# Patient Record
Sex: Female | Born: 1985 | Race: White | Hispanic: No | Marital: Single | Smoking: Never smoker
Health system: Southern US, Community
[De-identification: ages and names within clinical notes are randomized; demographics above are authoritative.]

## PROBLEM LIST (undated history)

## (undated) HISTORY — PX: APPENDECTOMY: SHX54

## (undated) HISTORY — PX: MENISCUS REPAIR: SHX5179

---
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
## 2005-06-29 ENCOUNTER — Other Ambulatory Visit: Admission: RE | Admit: 2005-06-29 | Discharge: 2005-06-29 | Payer: Self-pay | Admitting: Obstetrics and Gynecology

## 2005-09-07 ENCOUNTER — Other Ambulatory Visit: Admission: RE | Admit: 2005-09-07 | Discharge: 2005-09-07 | Payer: Self-pay | Admitting: Obstetrics & Gynecology

## 2010-11-09 ENCOUNTER — Other Ambulatory Visit: Payer: Self-pay | Admitting: Internal Medicine

## 2010-11-09 DIAGNOSIS — R1011 Right upper quadrant pain: Secondary | ICD-10-CM

## 2010-11-09 DIAGNOSIS — R110 Nausea: Secondary | ICD-10-CM

## 2010-11-10 ENCOUNTER — Ambulatory Visit
Admission: RE | Admit: 2010-11-10 | Discharge: 2010-11-10 | Disposition: A | Discharge status: Home / Self Care | Payer: 59 | Source: Ambulatory Visit | Attending: Internal Medicine | Admitting: Internal Medicine

## 2010-11-10 DIAGNOSIS — R1011 Right upper quadrant pain: Secondary | ICD-10-CM

## 2010-11-10 DIAGNOSIS — R110 Nausea: Secondary | ICD-10-CM

## 2010-11-13 ENCOUNTER — Ambulatory Visit
Admission: RE | Admit: 2010-11-13 | Discharge: 2010-11-13 | Disposition: A | Discharge status: Home / Self Care | Payer: 59 | Source: Ambulatory Visit | Attending: Internal Medicine | Admitting: Internal Medicine

## 2010-11-13 ENCOUNTER — Other Ambulatory Visit: Payer: Self-pay | Admitting: Internal Medicine

## 2010-11-13 DIAGNOSIS — R634 Abnormal weight loss: Secondary | ICD-10-CM

## 2010-11-13 DIAGNOSIS — R110 Nausea: Secondary | ICD-10-CM

## 2010-11-13 MED ORDER — IOHEXOL 300 MG/ML  SOLN
100.0000 mL | Freq: Once | INTRAMUSCULAR | Status: AC | PRN
  Administered 2010-11-13: 100 mL via INTRAVENOUS

## 2011-07-15 ENCOUNTER — Ambulatory Visit (INDEPENDENT_AMBULATORY_CARE_PROVIDER_SITE_OTHER): Payer: 59 | Admitting: Family Medicine

## 2011-07-15 ENCOUNTER — Ambulatory Visit: Payer: 59

## 2011-07-15 VITALS — BP 126/81 | HR 65 | Temp 98.9°F | Resp 16 | Ht 65.0 in | Wt 153.0 lb

## 2011-07-15 DIAGNOSIS — K5900 Constipation, unspecified: Secondary | ICD-10-CM

## 2011-07-15 DIAGNOSIS — R1011 Right upper quadrant pain: Secondary | ICD-10-CM

## 2011-07-15 DIAGNOSIS — R209 Unspecified disturbances of skin sensation: Secondary | ICD-10-CM

## 2011-07-15 DIAGNOSIS — R200 Anesthesia of skin: Secondary | ICD-10-CM

## 2011-07-15 MED ORDER — HYOSCYAMINE SULFATE ER 0.375 MG PO TB12: 0.3750 mg | ORAL_TABLET | Freq: Two times a day (BID) | ORAL | Status: DC | PRN

## 2011-07-15 NOTE — Progress Notes (Signed)
  Subjective:    Patient ID: Lydia Jensen, female    DOB: 1986/05/27, 26 y.o.   MRN: [id number]  HPI 26 yo female with GI complaints and numbness.  1) numbness - occurred during yoga last night.  Left elbow down. Still tingling.  Not weak. No injury.  Not cold.  Can feel, just feels different.  2)    Abdominal pian - off and on since June.  CT scan then showed constipation.  Was rec'd to see GI but she never followed through. Was also advised to use miralax.  Does off and on.  Continues to struggle off and on with constipation and diarrhea.  Stomach hurts most of the time, especially with pressure like when laying on it in yoga or pressing on it.  Thinks about it a lot.  Patient very worried about it.  Initially when it started has lost 20-25# without trying.  Since then fluctuates and overall weight up a little.     Review of Systems Negative except as per HPI     Objective:   Physical Exam  Constitutional: Vital signs are normal. She appears well-developed and well-nourished. She is active.  Cardiovascular: Normal rate, regular rhythm, normal heart sounds and normal pulses.   Pulmonary/Chest: Effort normal and breath sounds normal.  Abdominal: Soft. Normal appearance and bowel sounds are normal. She exhibits no distension and no mass. There is no hepatosplenomegaly. There is tenderness. There is no rigidity, no rebound, no guarding, no CVA tenderness, no tenderness at McBurney's point and negative Murphy's sign. No hernia.  Neurological: She is alert.   Tenderness is mild and mostly concentrated in RUQ.  [facility] Primary radiology reading by Dr. Georgiana Shore: Large stool in right colon with large gas bubble at hepatic flexure. No air fluid levels.     Assessment & Plan:  Abdominal pian, constipation.  Persistent and worrisome to patient.  ?IBS.  Continue miralax.  Try hyoscyamine for cramping.  GI Referral.   Paraesthesias - monitor.  INB in 3-4 days, rtc.

## 2011-07-16 ENCOUNTER — Telehealth: Payer: Self-pay

## 2011-07-16 NOTE — Telephone Encounter (Signed)
PT WANTS DR DOOLITTLE TO REVIEW HER X-RAY AND CALL HER ON CELL PHONE

## 2011-07-18 NOTE — Telephone Encounter (Signed)
Dr Merla Riches pts cell phone number is [phone]

## 2011-07-18 NOTE — Telephone Encounter (Signed)
Need cell phone # to be part of this message and all messages about callbacks

## 2011-07-19 ENCOUNTER — Telehealth: Payer: Self-pay

## 2011-07-19 NOTE — Telephone Encounter (Signed)
Patient's mother calling for patient who had to work today. Needs all records, notes, x-rays pertaining to digestive issues faxed to specialist Dr. Sharrell Ku at [phone]. Says Dr. Merla Riches advised her to see specialist before, and she never went. Decided to go after having another episode last week and being seen by. Dr. Georgiana Shore at walk-in center.

## 2011-07-23 ENCOUNTER — Encounter: Payer: Self-pay | Admitting: Gastroenterology

## 2011-08-03 ENCOUNTER — Ambulatory Visit: Payer: 59 | Admitting: Gastroenterology

## 2011-08-18 ENCOUNTER — Telehealth: Payer: Self-pay

## 2011-08-18 NOTE — Telephone Encounter (Signed)
Dr. Merla Riches patient  Needs refill on adderall xr 30 mg   and Adderall plain 15 mg    ALSO, needs copy of x-ray to take to MD appt next week.    Please call [phone]

## 2011-08-19 NOTE — Telephone Encounter (Signed)
Please pull chart and route message to pa pool. Thanks

## 2011-08-19 NOTE — Telephone Encounter (Signed)
Sorry accidentally hit close encounter instead of route. Ok to creat addendum

## 2011-08-19 NOTE — Telephone Encounter (Signed)
Dr Esaw Grandchild, do you want to RF pt's adderall xr 30 and adderall 15 Rxs? You saw her last month, but not for this. It looks like pt is due for f/up for ADD with Dr Merla Riches, but he is not here this week. I'll put pt's chart in your box. Please advise and we'll let the pt know

## 2011-08-20 NOTE — Telephone Encounter (Signed)
Xray is already copied and in p/up drawer.

## 2011-08-21 NOTE — Telephone Encounter (Signed)
No to adderall refill.  Did not discuss.

## 2011-08-23 NOTE — Telephone Encounter (Signed)
Pt came into p/up her xray and spoke with me about whether Dr Merla Riches would RF her XR Adderall. She said she doesn't need a RF of her reg bc she doesn't use it as often. Pt really can't get in to see Dr Merla Riches this week, but will reschedule soon if needed. She stated she was just here, but Dr Merla Riches wasn't here so she had to see another provider and didn't think to discuss ADD with her bc she wasn't her reg MD. She really can't afford another co-pay right now and doesn't really want to come back in so soon right after she was here. She is having to f/up with the abd issues she was here for.

## 2011-08-23 NOTE — Telephone Encounter (Signed)
LMOM for pt with message from Dr Georgiana Shore and left Dr Merla Riches schedule this week on VM.

## 2011-08-25 ENCOUNTER — Telehealth: Payer: Self-pay

## 2011-08-25 MED ORDER — AMPHETAMINE-DEXTROAMPHET ER 30 MG PO CP24: 30.0000 mg | ORAL_CAPSULE | ORAL | Status: DC

## 2011-08-25 NOTE — Telephone Encounter (Signed)
Okay to call her to pick up Adderall refills which I have tried to print tonight and can sign tomorrow morning

## 2011-08-25 NOTE — Telephone Encounter (Signed)
.  umfc The patient called to state that she had called regarding a prescription over a week ago and she had not received a return call.  No other open/resolved calls noted.  Patient would like return call at [phone] and stated she is a patient of Dr. Merla Riches, not Dr. Georgiana Shore who she saw in February.  Please call patient regarding this matter.

## 2011-08-25 NOTE — Telephone Encounter (Signed)
Addended by: Tonye Pearson on: 08/25/2011 11:45 PM   Modules accepted: Orders

## 2011-08-26 NOTE — Telephone Encounter (Signed)
LMOM that rx is ready

## 2011-08-26 NOTE — Telephone Encounter (Signed)
rx's are up front for pt to pick up. i informed her of this.

## 2011-10-01 ENCOUNTER — Ambulatory Visit (INDEPENDENT_AMBULATORY_CARE_PROVIDER_SITE_OTHER): Payer: 59 | Admitting: Internal Medicine

## 2011-10-01 VITALS — BP 132/85 | HR 76 | Temp 99.0°F | Resp 16 | Ht 66.75 in | Wt 156.8 lb

## 2011-10-01 DIAGNOSIS — F988 Other specified behavioral and emotional disorders with onset usually occurring in childhood and adolescence: Secondary | ICD-10-CM

## 2011-10-01 MED ORDER — AMPHETAMINE-DEXTROAMPHETAMINE 15 MG PO TABS: 15.0000 mg | ORAL_TABLET | Freq: Every day | ORAL | Status: DC

## 2011-10-01 MED ORDER — AMPHETAMINE-DEXTROAMPHET ER 30 MG PO CP24: 30.0000 mg | ORAL_CAPSULE | ORAL | Status: DC

## 2011-10-01 NOTE — Progress Notes (Signed)
  Subjective:    Patient ID: Lydia Jensen, female    DOB: [birth year], 26 y.o.   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 Kinnie Scales chronic abdominal pain has discovered no etiology and she is in the third week of the gluten-free diet/she has some insomnia related to the stress of the decisions before her and some daytime anxiety as well/she has intermittent constipation     Objective:   Physical Exam Vital signs stable Neuro intact        Assessment & Plan:  Problem #1 ADD Meds ordered this encounter  Medications  . DISCONTD: amphetamine-dextroamphetamine (ADDERALL) 15 MG tablet    Sig: Take 15 mg by mouth daily.   Marland Kitchen amphetamine-dextroamphetamine (ADDERALL) 15 MG tablet    Sig: Take 1 tablet (15 mg total) by mouth daily.    Dispense:  30 tablet    Refill:  0  . amphetamine-dextroamphetamine (ADDERALL XR) 30 MG 24 hr capsule    Sig: Take 1 capsule (30 mg total) by mouth every morning.    Dispense:  30 capsule    Refill:  0  . amphetamine-dextroamphetamine (ADDERALL XR) 30 MG 24 hr capsule    Sig: Take 1 capsule (30 mg total) by mouth every morning. 11/01/11/or after    Dispense:  30 capsule    Refill:  0  . amphetamine-dextroamphetamine (ADDERALL XR) 30 MG 24 hr capsule    Sig: Take 1 capsule (30 mg total) by mouth every morning. For 12/01/11 or after    Dispense:  30 capsule    Refill:  0  . amphetamine-dextroamphetamine (ADDERALL, 15MG ,) 15 MG tablet    Sig: Take 1 tablet (15 mg total) by mouth daily. For 11/01/11    Dispense:  30 tablet    Refill:  0  . amphetamine-dextroamphetamine (ADDERALL, 15MG ,) 15 MG tablet    Sig: Take 1 tablet (15 mg total) by mouth daily. For 12/01/11 or after    Dispense:  30 tablet    Refill:  0   Call in 3 months for refills and followup in 6 months

## 2012-01-16 ENCOUNTER — Telehealth: Payer: Self-pay

## 2012-01-16 NOTE — Telephone Encounter (Signed)
Patient request refill on Adderall 30mg  ex, 15mg  regular x 3 months.

## 2012-01-17 MED ORDER — AMPHETAMINE-DEXTROAMPHETAMINE 15 MG PO TABS: 15.0000 mg | ORAL_TABLET | Freq: Every day | ORAL | Status: DC

## 2012-01-17 MED ORDER — AMPHETAMINE-DEXTROAMPHET ER 30 MG PO CP24: 30.0000 mg | ORAL_CAPSULE | ORAL | Status: DC

## 2012-01-17 NOTE — Telephone Encounter (Signed)
LMOM that Rxs are ready for p/up and that she needs OV for more.

## 2012-01-17 NOTE — Telephone Encounter (Signed)
Rx done x 3 months then needs office visit 

## 2012-03-31 ENCOUNTER — Telehealth: Payer: Self-pay

## 2012-03-31 NOTE — Telephone Encounter (Signed)
Patient due for follow up this month, states no insurance. Please advise on request for medication change, not due for this to be filled until middle of month.

## 2012-03-31 NOTE — Telephone Encounter (Signed)
Pt of Dr. Merla Riches would like for his nurse to call her back about possibly changing her rx from adderall to retilin due to the fact that she has no insurance. [phone]

## 2012-03-31 NOTE — Telephone Encounter (Signed)
Dr. Doolittle, please advise. 

## 2012-04-02 NOTE — Telephone Encounter (Signed)
No answer was called back Closest medication would be Ritalin 20 mg regular release to be taken 3 times a day Left message that we would try to get in touch again

## 2012-04-04 ENCOUNTER — Telehealth: Payer: Self-pay

## 2012-04-04 NOTE — Telephone Encounter (Signed)
Returning Amy's call.    [phone]

## 2012-04-04 NOTE — Telephone Encounter (Signed)
I tried calling her this weekend and missed her Her only option with Ritalin would be to try 20 mg 3 times a day every 5 hours

## 2012-04-04 NOTE — Telephone Encounter (Signed)
Pt is returning amy/clincal call regarding her medication

## 2012-04-04 NOTE — Telephone Encounter (Signed)
Left message for her to call me back. 

## 2012-04-05 NOTE — Telephone Encounter (Signed)
Left another message for her. She called back yesterday evening.

## 2012-04-09 NOTE — Telephone Encounter (Signed)
Sent unable to reach letter.

## 2012-07-28 ENCOUNTER — Other Ambulatory Visit: Payer: Self-pay | Admitting: Physician Assistant

## 2012-07-28 MED ORDER — AMPHETAMINE-DEXTROAMPHET ER 30 MG PO CP24: 30.0000 mg | ORAL_CAPSULE | ORAL | Status: DC

## 2012-07-28 MED ORDER — AMPHETAMINE-DEXTROAMPHETAMINE 15 MG PO TABS: 15.0000 mg | ORAL_TABLET | Freq: Every day | ORAL | Status: DC

## 2012-07-28 MED ORDER — ALPRAZOLAM 0.5 MG PO TABS: 0.2500 mg | ORAL_TABLET | Freq: Every evening | ORAL | Status: DC | PRN

## 2012-07-28 NOTE — Telephone Encounter (Signed)
Pt presents to be seen by Dr Merla Riches but he was no longer at the clinic today.  I offered to write her 1 month supply of her medications and then she has to [facility] for a visit with Dr Merla Riches.  She agrees and understands.  The Rx were given to her while I was there.

## 2012-10-02 ENCOUNTER — Telehealth: Payer: Self-pay

## 2012-10-02 DIAGNOSIS — F988 Other specified behavioral and emotional disorders with onset usually occurring in childhood and adolescence: Secondary | ICD-10-CM

## 2012-10-02 NOTE — Telephone Encounter (Signed)
PT LIVES OUT OF TOWN AND WILL NOT BE ABLE TO COME SEE DR Merla Riches UNTIL 10/20/12 PT NEEDS A REFILL ON ADDERALL REFILL

## 2012-10-04 MED ORDER — AMPHETAMINE-DEXTROAMPHETAMINE 15 MG PO TABS: 15.0000 mg | ORAL_TABLET | Freq: Every day | ORAL | Status: DC

## 2012-10-04 MED ORDER — AMPHETAMINE-DEXTROAMPHET ER 30 MG PO CP24: 30.0000 mg | ORAL_CAPSULE | ORAL | Status: DC

## 2012-10-04 NOTE — Telephone Encounter (Signed)
Ok Meds ordered this encounter  Medications  . amphetamine-dextroamphetamine (ADDERALL XR) 30 MG 24 hr capsule    Sig: Take 1 capsule (30 mg total) by mouth every morning.    Dispense:  30 capsule    Refill:  0  . amphetamine-dextroamphetamine (ADDERALL) 15 MG tablet    Sig: Take 1 tablet (15 mg total) by mouth daily.    Dispense:  30 tablet    Refill:  0

## 2012-10-05 NOTE — Telephone Encounter (Signed)
Patient advised this is at front desk for pick up .

## 2012-10-20 ENCOUNTER — Ambulatory Visit: Payer: BC Managed Care – PPO | Admitting: Internal Medicine

## 2012-10-20 VITALS — BP 120/80 | HR 71 | Temp 98.1°F | Resp 18 | Ht 66.75 in | Wt 155.0 lb

## 2012-10-20 DIAGNOSIS — F988 Other specified behavioral and emotional disorders with onset usually occurring in childhood and adolescence: Secondary | ICD-10-CM

## 2012-10-20 DIAGNOSIS — M4120 Other idiopathic scoliosis, site unspecified: Secondary | ICD-10-CM | POA: Insufficient documentation

## 2012-10-20 DIAGNOSIS — K589 Irritable bowel syndrome without diarrhea: Secondary | ICD-10-CM

## 2012-10-20 MED ORDER — AMPHETAMINE-DEXTROAMPHETAMINE 15 MG PO TABS: 15.0000 mg | ORAL_TABLET | Freq: Every day | ORAL | Status: DC

## 2012-10-20 MED ORDER — AMPHETAMINE-DEXTROAMPHET ER 30 MG PO CP24: 30.0000 mg | ORAL_CAPSULE | ORAL | Status: DC

## 2012-10-20 MED ORDER — ALPRAZOLAM 0.5 MG PO TABS: 0.2500 mg | ORAL_TABLET | Freq: Every evening | ORAL | Status: DC | PRN

## 2012-10-20 NOTE — Progress Notes (Signed)
  Subjective:    Patient ID: Lydia Jensen, female    DOB: 1986-05-09, 27 y.o.   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 Kinnie Scales that did not include colonoscopy Unfortunately she still has 8-10 stools a day of diarrhea and postprandial right up quadrant pain(ultrasound was normal)  Now working out more and losing weight/diet much improved Rarely needs Xanax for anxiety /greatly improved//   Review of Systems change in birth control pills made headaches disappear    Negative  Objective:   Physical Exam BP 120/80  Pulse 71  Temp(Src) 98.1 F (36.7 C) (Oral)  Resp 18  Ht 5' 6.75" (1.695 m)  Wt 155 lb (70.308 kg)  BMI 24.47 kg/m2  LMP 10/20/2012  HEENT clear Abdomen benign Extremities with full peripheral pulses Scoliosis stable Mood good     Assessment & Plan:  Add  Scoliosis- letter for elevated desk  Meds ordered this encounter  Medications  . amphetamine-dextroamphetamine (ADDERALL) 15 MG tablet    Sig: Take 1 tablet (15 mg total) by mouth daily.    Dispense:  30 tablet    Refill:  0  . amphetamine-dextroamphetamine (ADDERALL XR) 30 MG 24 hr capsule    Sig: Take 1 capsule (30 mg total) by mouth every morning.    Dispense:  30 capsule    Refill:  0  . amphetamine-dextroamphetamine (ADDERALL) 15 MG tablet    Sig: Take 1 tablet (15 mg total) by mouth daily.    Dispense:  30 tablet    Refill:  0  . amphetamine-dextroamphetamine (ADDERALL XR) 30 MG 24 hr capsule    Sig: Take 1 capsule (30 mg total) by mouth every morning.    Dispense:  30 capsule    Refill:  0  . amphetamine-dextroamphetamine (ADDERALL XR) 30 MG 24 hr capsule    Sig: Take 1 capsule (30 mg total) by mouth every morning.    Dispense:  30 capsule    Refill:  0    Order Specific  Question:  Supervising Provider    Answer:  Terin Dierolf P [[id number]]  . amphetamine-dextroamphetamine (ADDERALL) 15 MG tablet    Sig: Take 1 tablet (15 mg total) by mouth daily.    Dispense:  30 tablet    Refill:  0    Order Specific Question:  Supervising Provider    Answer:  Avis Tirone P [[id number]]  . ALPRAZolam (XANAX) 0.5 MG tablet    Sig: Take 0.5-1 tablets (0.25-0.5 mg total) by mouth at bedtime as needed for sleep.    Dispense:  20 tablet    Refill:  0    Order Specific Question:  Supervising Provider    Answer:  Tonye Pearson [[id number]]  May call for refills in 3 months if needed and followup in 6 months

## 2013-02-23 ENCOUNTER — Telehealth: Payer: Self-pay

## 2013-02-23 DIAGNOSIS — F988 Other specified behavioral and emotional disorders with onset usually occurring in childhood and adolescence: Secondary | ICD-10-CM

## 2013-02-23 NOTE — Telephone Encounter (Signed)
Pt would like a refill on Adderall 30 mg xr and Adderall 15 mg. Best# [phone] pts mom will be picking these up: Delma Freeze

## 2013-02-25 MED ORDER — AMPHETAMINE-DEXTROAMPHET ER 30 MG PO CP24: 30.0000 mg | ORAL_CAPSULE | ORAL | Status: DC

## 2013-02-25 MED ORDER — AMPHETAMINE-DEXTROAMPHETAMINE 15 MG PO TABS: 15.0000 mg | ORAL_TABLET | Freq: Every day | ORAL | Status: DC

## 2013-02-25 NOTE — Telephone Encounter (Signed)
Called pt, LMOM Rx's ready to pick up. 

## 2013-02-25 NOTE — Telephone Encounter (Signed)
Meds ordered this encounter  Medications  . amphetamine-dextroamphetamine (ADDERALL) 15 MG tablet    Sig: Take 1 tablet (15 mg total) by mouth daily.    Dispense:  30 tablet    Refill:  0    Order Specific Question:  Supervising Provider    Answer:  Perrion Diesel P [[id number]]  . amphetamine-dextroamphetamine (ADDERALL XR) 30 MG 24 hr capsule    Sig: Take 1 capsule (30 mg total) by mouth every morning.    Dispense:  30 capsule    Refill:  0  . amphetamine-dextroamphetamine (ADDERALL XR) 30 MG 24 hr capsule    Sig: Take 1 capsule (30 mg total) by mouth every morning.    Dispense:  30 capsule    Refill:  0  . amphetamine-dextroamphetamine (ADDERALL XR) 30 MG 24 hr capsule    Sig: Take 1 capsule (30 mg total) by mouth every morning.    Dispense:  30 capsule    Refill:  0    Order Specific Question:  Supervising Provider    Answer:  Damian Buckles P [[id number]]  . amphetamine-dextroamphetamine (ADDERALL) 15 MG tablet    Sig: Take 1 tablet (15 mg total) by mouth daily.    Dispense:  30 tablet    Refill:  0  . amphetamine-dextroamphetamine (ADDERALL) 15 MG tablet    Sig: Take 1 tablet (15 mg total) by mouth daily.    Dispense:  30 tablet    Refill:  0

## 2013-04-17 NOTE — Progress Notes (Signed)
PA approved for Adderall ER 30 mg and Adderall 15 mg through 04/16/16. Faxed pharmacy.

## 2013-04-19 ENCOUNTER — Telehealth: Payer: Self-pay

## 2013-04-19 DIAGNOSIS — R5381 Other malaise: Secondary | ICD-10-CM

## 2013-04-19 DIAGNOSIS — R51 Headache: Secondary | ICD-10-CM

## 2013-04-19 NOTE — Telephone Encounter (Signed)
Pt states she took rx for adderall written by dr [facility] Riches [facility] and they stated her insurance required prior auth for it.  [facility] [place] [phone]  bf

## 2013-04-20 NOTE — Telephone Encounter (Signed)
Spoke with Claretta Fraise did PA for adderall and has faxed it to the pharmacy. I let the pt know.

## 2013-05-11 ENCOUNTER — Ambulatory Visit (INDEPENDENT_AMBULATORY_CARE_PROVIDER_SITE_OTHER): Payer: BC Managed Care – PPO | Admitting: Internal Medicine

## 2013-05-11 VITALS — BP 112/70 | HR 81 | Temp 98.4°F | Resp 18 | Ht 65.5 in | Wt 157.0 lb

## 2013-05-11 DIAGNOSIS — R5381 Other malaise: Secondary | ICD-10-CM

## 2013-05-11 DIAGNOSIS — R51 Headache: Secondary | ICD-10-CM

## 2013-05-11 DIAGNOSIS — N926 Irregular menstruation, unspecified: Secondary | ICD-10-CM

## 2013-05-11 DIAGNOSIS — L299 Pruritus, unspecified: Secondary | ICD-10-CM

## 2013-05-11 DIAGNOSIS — R112 Nausea with vomiting, unspecified: Secondary | ICD-10-CM

## 2013-05-11 DIAGNOSIS — R42 Dizziness and giddiness: Secondary | ICD-10-CM

## 2013-05-11 DIAGNOSIS — H538 Other visual disturbances: Secondary | ICD-10-CM

## 2013-05-11 DIAGNOSIS — F988 Other specified behavioral and emotional disorders with onset usually occurring in childhood and adolescence: Secondary | ICD-10-CM

## 2013-05-11 DIAGNOSIS — K589 Irritable bowel syndrome without diarrhea: Secondary | ICD-10-CM

## 2013-05-11 LAB — POCT CBC
HCT, POC: 45.9 % (ref 37.7–47.9)
Hemoglobin: 14.5 g/dL (ref 12.2–16.2)
Lymph, poc: 1.5 (ref 0.6–3.4)
MCHC: 31.6 g/dL — AB (ref 31.8–35.4)
MCV: 93.6 fL (ref 80–97)
POC Granulocyte: 3.9 (ref 2–6.9)
POC LYMPH PERCENT: 26 %L (ref 10–50)
RDW, POC: 13.5 %
WBC: 5.8 10*3/uL (ref 4.6–10.2)

## 2013-05-11 LAB — COMPREHENSIVE METABOLIC PANEL
Alkaline Phosphatase: 65 U/L (ref 39–117)
BUN: 10 mg/dL (ref 6–23)
Creat: 0.86 mg/dL (ref 0.50–1.10)
Glucose, Bld: 95 mg/dL (ref 70–99)
Total Bilirubin: 0.4 mg/dL (ref 0.3–1.2)

## 2013-05-11 LAB — TSH: TSH: 0.713 u[IU]/mL (ref 0.350–4.500)

## 2013-05-11 MED ORDER — AMPHETAMINE-DEXTROAMPHET ER 30 MG PO CP24: 30.0000 mg | ORAL_CAPSULE | ORAL | Status: DC

## 2013-05-11 MED ORDER — AMPHETAMINE-DEXTROAMPHETAMINE 15 MG PO TABS: 15.0000 mg | ORAL_TABLET | Freq: Every day | ORAL | Status: DC

## 2013-05-11 MED ORDER — ALPRAZOLAM 0.5 MG PO TABS: 0.2500 mg | ORAL_TABLET | Freq: Every evening | ORAL | Status: DC | PRN

## 2013-05-11 MED ORDER — CYCLOBENZAPRINE HCL 10 MG PO TABS: 10.0000 mg | ORAL_TABLET | Freq: Every day | ORAL | Status: DC

## 2013-05-11 NOTE — Progress Notes (Addendum)
Subjective:  This chart was scribed for Lydia Sia, MD by Andrew Au, ED Scribe. This patient was seen in room Room/bed info not found and the patient's care was started at 12:44.   Patient ID: Lydia Jensen, female    DOB: 1986-04-29, 27 y.o.   MRN: [id number]  HPI  This chart was scribed for Lydia Sia MD, by Andrew Au, Scribe. This patient was seen in room 2 and the patient's care was started at 12:44 PM.  HPI Comments: Lydia Jensen is a 27 y.o. female who presents to the [facility] complaining of constant, gradually worsening, unchanging, posterior HA onset, month and a half ago. She described her [name] as daily, onset when she wakes up or sometimes when she is at work. She reoports associated emesis, dizziness, fatigue, and disoriented vision with a HA. She reports the HA wakes her from her sleep which has resulted in poor sleep.Pt reports she has been consistently working out with a trainer since the summer and the HA has been unchanged. Pt reports she went to the ER and received a UA and CT scan and states nothing was found she also received pain medication at that time without relief. Pt also reports appetite change that has lessened which has resulted losing wieght. . Pt states that she is gluten free and that she has started eating meat.   Pt reported a sore throat which occurred about a month and half ago along with her HA. Her sore throat resolved on her own after Two weeks. She reported feeling fatigue after this time period.    She also complains of disoriented feeling that started a week before thanksgiving. Pt states that she feels that she feels off balance/lightheaded/occasionally nauseated. She reports her last episode of emesis was last week. Occasional blurring of vision.  She also complains of concentrated urine recently but without dysuria or frequency .  Pt states she takes birth control(urana?) and that she has had an irregular  mentrsaul cycle/with BTB-no pain or dyspar or missed pills  Pt complains of itchy breasts without lumps, rash. She wakens at night with these symptoms and feels worse when having to wear clothing. There is no nipple discharge  She denies history of mono.  She states her mother history of breast cancer.   Patient Active Problem List   Diagnosis Date Noted  . Attention deficit disorder without mention of hyperactivity 10/20/2012  . Idiopathic scoliosis 10/20/2012   No Known Allergies  No orders of the defined types were placed in this encounter.   Current outpatient prescriptions:ALPRAZolam (XANAX XR) 0.5 MG 24 hr tablet, Take 0.5 mg by mouth as needed. Pt taking 1/2 tablet PRN amphetamine-dextroamphetamine (ADDERALL XR) 30 MG 24 hr capsule, Take 1 capsule (30 mg total) by mouth every morning.,  drospirenone-ethinyl estradiol (YAZ,GIANVI,LORYNA) 3-0.02 MG tablet, Take 1 tablet by mouth daily. cyclobenzaprine (FLEXERIL) 10 MG tablet, Take 1 tablet (10 mg total) by mouth at bedtime    Review of Systems  Constitutional: Positive for appetite change ( lessened) and fatigue. Negative for fever, chills, diaphoresis and activity change.       Able to exercise despite her headache symptoms  HENT: Positive for sore throat. Negative for dental problem, rhinorrhea and sneezing.        Has had a few globus symptoms  Eyes: Positive for visual disturbance.  Respiratory: Negative for cough, chest tightness and shortness of breath.   Cardiovascular: Negative for chest pain, palpitations and leg swelling.  Gastrointestinal: Positive for nausea and vomiting. Negative for abdominal pain, diarrhea and constipation.  Genitourinary: Positive for menstrual problem. Negative for frequency, flank pain and dyspareunia.  Skin: Negative for rash.  Neurological: Positive for dizziness and headaches.  Hematological: Does not bruise/bleed easily.  Psychiatric/Behavioral: Positive for sleep disturbance. Negative  for behavioral problems, dysphoric mood and decreased concentration. The patient is nervous/anxious.    Triage Vitals BP 112/70  Pulse 81  Temp(Src) 98.4 F (36.9 C) (Oral)  Resp 18  Ht 5' 5.5" (1.664 m)  Wt 157 lb (71.215 kg)  BMI 25.72 kg/m2  SpO2 99%  LMP 05/11/2013     Objective:   Physical Exam  Nursing note and vitals reviewed. Constitutional: She is oriented to person, place, and time. She appears well-developed and well-nourished. No distress.  HENT:  Head: Normocephalic and atraumatic.  Eyes: EOM are normal.  Neck: Neck supple.  Cardiovascular: Normal rate.   Pulmonary/Chest: Effort normal. No respiratory distress. She has no wheezes. She has no rales.  Musculoskeletal: Normal range of motion.  Neurological: She is alert and oriented to person, place, and time.  Skin: Skin is warm and dry.  Psychiatric: She has a normal mood and affect. Her behavior is normal.   Results for orders placed in visit on 05/11/13  COMPREHENSIVE METABOLIC PANEL      Result Value Range   Sodium 138  135 - 145 mEq/L   Potassium 4.5  3.5 - 5.3 mEq/L   Chloride 102  96 - 112 mEq/L   CO2 24  19 - 32 mEq/L   Glucose, Bld 95  70 - 99 mg/dL   BUN 10  6 - 23 mg/dL   Creat 4.09  8.11 - 9.14 mg/dL   Total Bilirubin 0.4  0.3 - 1.2 mg/dL   Alkaline Phosphatase 65  39 - 117 U/L   AST 21  0 - 37 U/L   ALT 16  0 - 35 U/L   Total Protein 7.1  6.0 - 8.3 g/dL   Albumin 4.1  3.5 - 5.2 g/dL   Calcium 9.1  8.4 - 78.2 mg/dL  TSH      Result Value Range   TSH 0.713  0.350 - 4.500 uIU/mL  EPSTEIN-BARR VIRUS VCA ANTIBODY PANEL      Result Value Range   EBV VCA IgG 165.0 (*) <18.0 U/mL   EBV VCA IgM <10.0  <36.0 U/mL   EBV EA IgG 24.2 (*) <9.0 U/mL   EBV NA IgG 70.7 (*) <18.0 U/mL  POCT CBC      Result Value Range   WBC 5.8  4.6 - 10.2 K/uL   Lymph, poc 1.5  0.6 - 3.4   POC LYMPH PERCENT 26.0  10 - 50 %L   MID (cbc) 0.4  0 - 0.9   POC MID % 7.1  0 - 12 %M   POC Granulocyte 3.9  2 - 6.9    Granulocyte percent 66.9  37 - 80 %G   RBC 4.90  4.04 - 5.48 M/uL   Hemoglobin 14.5  12.2 - 16.2 g/dL   HCT, POC 95.6  21.3 - 47.9 %   MCV 93.6  80 - 97 fL   MCH, POC 29.6  27 - 31.2 pg   MCHC 31.6 (*) 31.8 - 35.4 g/dL   RDW, POC 08.6     Platelet Count, POC 297  142 - 424 K/uL   MPV 7.6  0 - 99.8 fL  POCT SEDIMENTATION RATE  Result Value Range   POCT SED RATE 9  0 - 22 mm/hr          Assessment & Plan:  I have completed the patient encounter in its entirety as documented by the scribe, with editing by me where necessary. Delois Tolbert P. Merla Riches, M.D.  HA (headache)---trial flexeril HS  Other malaise and fatigue - Plan: POCT CBC, Comprehensive metabolic panel, POCT SEDIMENTATION RATE, TSH, Epstein-Barr virus VCA antibody panel  Nausea with vomiting  Blurred vision, bilateral  Dizzy  Irregular menses  Pruritus  IBS (irritable bowel syndrome) - Plan: ALPRAZolam (XANAX) 0.5 MG tablet  ADD (attention deficit disorder) - Plan: amphetamine-dextroamphetamine (ADDERALL XR) 30 MG 24 hr capsule, amphetamine-dextroamphetamine (ADDERALL XR) 30 MG 24 hr capsule, amphetamine-dextroamphetamine (ADDERALL XR) 30 MG 24 hr capsule, amphetamine-dextroamphetamine (ADDERALL) 15 MG tablet, amphetamine-dextroamphetamine (ADDERALL) 15 MG tablet, amphetamine-dextroamphetamine (ADDERALL) 15 MG tablet  Plan-in view of normal labs needs to proceed with MRI brain and neurological consultation, and we'll try to arrange these both in [facility]

## 2013-05-12 LAB — EPSTEIN-BARR VIRUS VCA ANTIBODY PANEL
EBV EA IgG: 24.2 U/mL — ABNORMAL HIGH (ref <9.0)
EBV NA IgG: 70.7 U/mL — ABNORMAL HIGH (ref <18.0)
EBV VCA IgG: 165 U/mL — ABNORMAL HIGH (ref <18.0)
EBV VCA IgM: <10 U/mL (ref <36.0)

## 2013-05-14 NOTE — Telephone Encounter (Signed)
Order placed [facility] hospital offers MRI services ([facility] they offer high quality services (my mother was there for a time when she lived near [place]) , will have MRI order sent there, also there is a mid sized neurology practice (5 doctors) I am not familiar with, but looks promising. [facility]. To you FYI

## 2013-05-14 NOTE — Telephone Encounter (Signed)
Message copied by Caffie Damme on Mon May 14, 2013 12:56 PM ------      Message from: Tonye Pearson      Created: Sun May 13, 2013  5:18 PM       I'd like to set up neurology eval and MRI brain without contrast in [place] for her      Can you find me some options? ------

## 2013-05-16 NOTE — Telephone Encounter (Signed)
Good work! This neuro practice sounds fine

## 2013-05-17 ENCOUNTER — Telehealth: Payer: Self-pay

## 2013-05-17 NOTE — Telephone Encounter (Signed)
Patient called requesting that Dr. Merla Riches call her back ASAP regarding her MRI that has not yet been scheduled. She asked to speak to Dr. Merla Riches specifically. Please return her call at [phone]. Thank you

## 2013-05-17 NOTE — Telephone Encounter (Signed)
Lydia Jensen contacted patient with referral information regarding the MRI. Referral faxed to hospital local to patient but according to patient, [name] mentioned that she's been having difficulties getting the fax to go through. Patient is upset regarding referral process and wants to speak with Dr. Merla Riches.   Please call patient.

## 2013-05-18 ENCOUNTER — Telehealth: Payer: Self-pay

## 2013-05-18 NOTE — Telephone Encounter (Signed)
I called and scheduled the MRI scan it is Jan 2nd at 7:30 am, she should arrive at 7 am Phone # [phone] Fax [phone] Called to advise, left message for her to call me back.

## 2013-05-18 NOTE — Telephone Encounter (Signed)
I have tried [facility], She has given me a number of  [phone], called but this is also [employer]. [place] has advised this is the soonest available scan for her.Patient will be here on Dec 26th. Called [facility] to see if they can accommodate her on this day. 6:30 am is all they have available on this day, I have scheduled it for her. Called her to advise. [address]. Patient will go for scan on this day she is very grateful this can be done at this time  Please patient/mom want to know what she can do for the pain while awaiting the scan.

## 2013-05-18 NOTE — Telephone Encounter (Signed)
Dr.Doolittle, Pt's mother would like to know if pt can have an MRI scheduled today pt's mother would like for Dr.Doolittle to call [facility] and schedule it today. Best# [phone]

## 2013-05-18 NOTE — Telephone Encounter (Signed)
Patients mother upset and would like to talk to Dr. Merla Riches on why Lydia Jensen has done the referral and not him. Patients mother is upset that the referral is on January 2nd at 7:30am. At [facility]. I think they are confused on how a referral process is done. I tried to explain that it was done, and that Lydia Jensen did her job correctly. Mother thinks otherwise because daughter is in pain and they must post pone mri till next year, they were hoping to get in this week. Please advise, mother wanted me to send this message to Dr. Merla Riches  Mother Wille Celeste is on her daughters HIPPA [phone]

## 2013-05-19 ENCOUNTER — Telehealth: Payer: Self-pay

## 2013-05-19 MED ORDER — BUTALBITAL-APAP-CAFFEINE 50-325-40 MG PO TABS: 1.0000 | ORAL_TABLET | Freq: Four times a day (QID) | ORAL | Status: DC | PRN

## 2013-05-19 MED ORDER — MELOXICAM 15 MG PO TABS: 15.0000 mg | ORAL_TABLET | Freq: Every day | ORAL | Status: DC

## 2013-05-19 NOTE — Telephone Encounter (Signed)
Patient returned call. Informed patient that her meds Meloxicam and Fioricet were sent to [facility]. [facility] [place] [phone]. Patient has requested that her meds go to this pharmacy from now on.   [phone]

## 2013-05-19 NOTE — Telephone Encounter (Signed)
meloxicam daily and fioricet prn Meds ordered this encounter  Medications  . meloxicam (MOBIC) 15 MG tablet    Sig: Take 1 tablet (15 mg total) by mouth daily. For headache    Dispense:  30 tablet    Refill:  0  . butalbital-acetaminophen-caffeine (FIORICET, ESGIC) 50-325-40 MG per tablet    Sig: Take 1-2 tablets by mouth every 6 (six) hours as needed for headache. No more than 6 tabs in 24 hrs    Dispense:  30 tablet    Refill:  2

## 2013-05-19 NOTE — Telephone Encounter (Signed)
Sent Rx's to pharmacy

## 2013-05-19 NOTE — Telephone Encounter (Addendum)
She could use melox and fioricet for pain --i left  pending to call in after pharm in [place] identified

## 2013-05-19 NOTE — Telephone Encounter (Signed)
LMOM meds sent in.

## 2013-05-25 ENCOUNTER — Ambulatory Visit
Admission: RE | Admit: 2013-05-25 | Discharge: 2013-05-25 | Disposition: A | Discharge status: Home / Self Care | Payer: BC Managed Care – PPO | Source: Ambulatory Visit | Attending: Internal Medicine | Admitting: Internal Medicine

## 2013-05-25 DIAGNOSIS — R51 Headache: Secondary | ICD-10-CM

## 2013-05-25 DIAGNOSIS — R5381 Other malaise: Secondary | ICD-10-CM

## 2013-06-20 ENCOUNTER — Telehealth: Payer: Self-pay

## 2013-06-20 NOTE — Telephone Encounter (Signed)
Patient returned our call regarding a referral by Jonelle Sidle Please call again  [phone]

## 2013-06-20 NOTE — Telephone Encounter (Signed)
Spoke with pt I am still working on her referral.

## 2013-06-20 NOTE — Telephone Encounter (Signed)
I tried to get pt in to see Dr Darlen Round but they need a referral from a PCP in [place]. I called pt to let her know to establish a PCP there and we can fax over her records if needed. Pt understood.

## 2013-10-09 ENCOUNTER — Telehealth: Payer: Self-pay

## 2013-10-09 NOTE — Telephone Encounter (Signed)
Lydia Jensen called back: has questions about her daughters MRI, did not want to get into too much detail, please call [phone] She will be in a Dr.apt at 11:30

## 2013-10-09 NOTE — Telephone Encounter (Signed)
Lydia Jensen WOULD LIKE TO SPEAK WITH DR DOOLITTLE REGARDING HER DAUGHTER AND DIDN'T WANT TO GET INTO THE DETAILS WITH [place] [phone]

## 2013-10-09 NOTE — Telephone Encounter (Signed)
LMVM we were returning her call.  Please call back.

## 2013-10-09 NOTE — Telephone Encounter (Signed)
States that daughter had an MRI ordered by Dr. Laney Pastor in December.  Wants to know why there was no contrast ordered and could they get a repeat MRI with contrast at no cost to them.  She lives in [place] and would like to have it scheduled there.  Stated that she called before right after procedure was done and they never got a call back.

## 2013-10-10 NOTE — Telephone Encounter (Signed)
Left message--this was neuro advice to scan w/out contrast with her symptoms Needs neuro eval next---I can refer to neuro in [place] I told her to call back if needed

## 2013-10-12 ENCOUNTER — Telehealth: Payer: Self-pay

## 2013-10-12 NOTE — Telephone Encounter (Signed)
THIS MESSAGE IS FOR DR. Laney Pastor: Muskaan'S MOTHER WANTS DR. Laney Pastor TO KNOW THAT SHE DID GET HIS MESSAGE. SHE WILL TALK TO [place] SOMETIME TODAY TO GET THE NECESSARY INFORMATION. SHE SAID THEY WILL PROBABLY NEED THE ORDER FOR AN EVALUATION TO SEE A NEUROLOGIST IN [place]. IF THERE ARE ANY OTHER QUESTIONS THAT DR. Laney Pastor HAS, SHE CAN BE REACHED TODAY AFTER 3:00. BEST PHONE [phone] (MOM'S NAME IS Neita Carp)  [place]

## 2013-10-30 ENCOUNTER — Telehealth: Payer: Self-pay

## 2013-10-30 DIAGNOSIS — F988 Other specified behavioral and emotional disorders with onset usually occurring in childhood and adolescence: Secondary | ICD-10-CM

## 2013-10-30 NOTE — Telephone Encounter (Signed)
Needs refills on amphetamine-dextroamphetamine (ADDERALL XR) 30 MG 24 hr capsule and amphetamine-dextroamphetamine (ADDERALL) 15 MG tablet

## 2013-10-31 MED ORDER — AMPHETAMINE-DEXTROAMPHETAMINE 15 MG PO TABS: 15.0000 mg | ORAL_TABLET | Freq: Every day | ORAL | Status: DC

## 2013-10-31 MED ORDER — AMPHETAMINE-DEXTROAMPHET ER 30 MG PO CP24: 30.0000 mg | ORAL_CAPSULE | ORAL | Status: DC

## 2013-10-31 NOTE — Telephone Encounter (Signed)
Meds ordered this encounter  Medications  . amphetamine-dextroamphetamine (ADDERALL) 15 MG tablet    Sig: Take 1 tablet (15 mg total) by mouth daily.    Dispense:  30 tablet    Refill:  0  . amphetamine-dextroamphetamine (ADDERALL) 15 MG tablet    Sig: Take 1 tablet (15 mg total) by mouth daily.    Dispense:  30 tablet    Refill:  0  . amphetamine-dextroamphetamine (ADDERALL) 15 MG tablet    Sig: Take 1 tablet (15 mg total) by mouth daily.    Dispense:  30 tablet    Refill:  0    Order Specific Question:  Supervising Provider    Answer:  DOOLITTLE, ROBERT P [[id number]]  . amphetamine-dextroamphetamine (ADDERALL XR) 30 MG 24 hr capsule    Sig: Take 1 capsule (30 mg total) by mouth every morning.    Dispense:  30 capsule    Refill:  0    Order Specific Question:  Supervising Provider    Answer:  DOOLITTLE, ROBERT P [[id number]]  . amphetamine-dextroamphetamine (ADDERALL XR) 30 MG 24 hr capsule    Sig: Take 1 capsule (30 mg total) by mouth every morning.    Dispense:  30 capsule    Refill:  0  . amphetamine-dextroamphetamine (ADDERALL XR) 30 MG 24 hr capsule    Sig: Take 1 capsule (30 mg total) by mouth every morning.    Dispense:  30 capsule    Refill:  0

## 2013-11-01 NOTE — Telephone Encounter (Signed)
Notified pt ready. Pt stated her mother will p/up.

## 2014-03-18 ENCOUNTER — Telehealth: Payer: Self-pay

## 2014-03-18 DIAGNOSIS — F988 Other specified behavioral and emotional disorders with onset usually occurring in childhood and adolescence: Secondary | ICD-10-CM

## 2014-03-18 MED ORDER — AMPHETAMINE-DEXTROAMPHETAMINE 15 MG PO TABS: 15.0000 mg | ORAL_TABLET | Freq: Every day | ORAL | Status: DC

## 2014-03-18 MED ORDER — AMPHETAMINE-DEXTROAMPHET ER 30 MG PO CP24: 30.0000 mg | ORAL_CAPSULE | ORAL | Status: DC

## 2014-03-18 NOTE — Telephone Encounter (Signed)
The patient called to request refill of Adderall.  The patient is aware of need for office visit.  She lives in [place], [place] and will be in town in three weeks for two weeks and will be happy to come in to see Dr. Laney Pastor at that time, but will need Rx prior to that time.  The patient is requesting Rx until she can come in to office in three weeks.  Please call the patient at [phone] when Rx ready for pick up.

## 2014-03-19 NOTE — Telephone Encounter (Signed)
Script in pick up drawer Pt advised.

## 2014-12-27 ENCOUNTER — Ambulatory Visit (INDEPENDENT_AMBULATORY_CARE_PROVIDER_SITE_OTHER): Payer: BLUE CROSS/BLUE SHIELD | Admitting: Internal Medicine

## 2014-12-27 DIAGNOSIS — G43109 Migraine with aura, not intractable, without status migrainosus: Secondary | ICD-10-CM

## 2014-12-27 DIAGNOSIS — F909 Attention-deficit hyperactivity disorder, unspecified type: Secondary | ICD-10-CM

## 2014-12-27 DIAGNOSIS — Z634 Disappearance and death of family member: Secondary | ICD-10-CM | POA: Diagnosis not present

## 2014-12-27 DIAGNOSIS — F988 Other specified behavioral and emotional disorders with onset usually occurring in childhood and adolescence: Secondary | ICD-10-CM

## 2014-12-27 DIAGNOSIS — G43909 Migraine, unspecified, not intractable, without status migrainosus: Secondary | ICD-10-CM | POA: Insufficient documentation

## 2014-12-27 MED ORDER — AMPHETAMINE-DEXTROAMPHET ER 30 MG PO CP24: 30.0000 mg | ORAL_CAPSULE | ORAL | Status: DC

## 2014-12-27 MED ORDER — AMPHETAMINE-DEXTROAMPHETAMINE 15 MG PO TABS: 15.0000 mg | ORAL_TABLET | Freq: Every day | ORAL | Status: DC

## 2014-12-27 MED ORDER — DROSPIRENONE-ETHINYL ESTRADIOL 3-0.02 MG PO TABS: 1.0000 | ORAL_TABLET | Freq: Every day | ORAL | Status: DC

## 2014-12-27 MED ORDER — TOPIRAMATE 100 MG PO TABS: 100.0000 mg | ORAL_TABLET | Freq: Every day | ORAL | Status: DC

## 2014-12-27 NOTE — Progress Notes (Signed)
Subjective:    Patient ID: Lydia Jensen, female    DOB: [birth year], 29 y.o.   MRN: [id number] This chart was scribed for Tami Lin, MD by Marti Sleigh, Medical Scribe. This patient was seen in Room 10 and the patient's care was started a 11:45 AM.  Chief Complaint  Patient presents with  . Medication Refill    Adderall 15mg , Adderall XR 30 mg   Patient Active Problem List   Diagnosis Date Noted  . Attention deficit disorder 10/20/2012    Priority: Medium  . Migraine headache 12/27/2014  . Bereavement--loss of father to etohism 2015 12/27/2014  . Idiopathic scoliosis 10/20/2012     HPI HPI Comments: Followed here since high school and then intermittently since her move to [place] is a 29 y.o. female who presents to [facility] reporting for a medication refill. Pt states she is working out regularly and has lost weight. Pt has been put on Topamax for headaches by her physician in [place], which initially reduced the frequency of HA but has seemed to reduce in effectiveness ove time. She is having intermittent tingling in hands and feet, which she believes is a side effect of the Topamax. Pt states her adderall prescription has been working for her. Pt states that brother Shanon Brow has just had two grand mal seizures, and was recently diagnosed with seizure disorder.   Pt's father passed away from an MI at the end of 2015. Pt's father was an alcoholic, but was doing well at the time of his passing except for extreme metabolic changes with weight loss. He had entered rehabilitation and was doing well.  She has a great deal of bereavement with many unsettled issues at this point as there was a lot of discord between father and the rest of the family over his drinking. This has precipitated and her desire to go ahead and pursue the life she has wanted. Pt is moving to [place], which she has wanted to do for many years. She currently has a good job for [employer].   Review of Systems  Constitutional: Negative for fever and chills.  Neurological: Positive for headaches.       Intermittent tingling in hands and feet      Objective:   Physical Exam  Constitutional: She is oriented to person, place, and time. She appears well-developed and well-nourished. No distress.  Pt is tearful during interview  HENT:  Head: Normocephalic and atraumatic.  Eyes: Pupils are equal, round, and reactive to light.  Neck: Neck supple.  Cardiovascular: Normal rate.   Pulmonary/Chest: Effort normal. No respiratory distress.  Musculoskeletal: Normal range of motion.  Neurological: She is alert and oriented to person, place, and time. Coordination normal.  Skin: Skin is warm and dry. She is not diaphoretic.  Psychiatric: She has a normal mood and affect. Her behavior is normal.  Nursing note and vitals reviewed.       Assessment & Plan:  ADD (attention deficit disorder) - Plan: amphetamine-dextroamphetamine (ADDERALL) 15 MG tablet, amphetamine-dextroamphetamine (ADDERALL) 15 MG tablet, amphetamine-dextroamphetamine (ADDERALL) 15 MG tablet, amphetamine-dextroamphetamine (ADDERALL XR) 30 MG 24 hr capsule, amphetamine-dextroamphetamine (ADDERALL XR) 30 MG 24 hr capsule, amphetamine-dextroamphetamine (ADDERALL XR) 30 MG 24 hr capsule  Migraine with aura and without status migrainosus, not intractable--- continue Topamax  Bereavement--loss of father to etohism 2015--- let me know if needs FMLA papers   Meds ordered this encounter  Medications  . DISCONTD: topiramate (TOPAMAX) 100 MG tablet    Sig:   .  drospirenone-ethinyl estradiol (YAZ,GIANVI,LORYNA) 3-0.02 MG tablet    Sig: Take 1 tablet by mouth daily.    Dispense:  1 Package    Refill:  11  . topiramate (TOPAMAX) 100 MG tablet    Sig: Take 1 tablet (100 mg total) by mouth daily.    Dispense:  90 tablet    Refill:  3  . amphetamine-dextroamphetamine (ADDERALL) 15 MG tablet    Sig: Take 1 tablet  by mouth daily. For 60 days after signed    Dispense:  30 tablet    Refill:  0  . amphetamine-dextroamphetamine (ADDERALL) 15 MG tablet    Sig: Take 1 tablet by mouth daily. For 30 days after signed    Dispense:  30 tablet    Refill:  0  . amphetamine-dextroamphetamine (ADDERALL) 15 MG tablet    Sig: Take 1 tablet by mouth daily.    Dispense:  30 tablet    Refill:  0  . amphetamine-dextroamphetamine (ADDERALL XR) 30 MG 24 hr capsule    Sig: Take 1 capsule (30 mg total) by mouth every morning. For 60 days after signed    Dispense:  30 capsule    Refill:  0  . amphetamine-dextroamphetamine (ADDERALL XR) 30 MG 24 hr capsule    Sig: Take 1 capsule (30 mg total) by mouth every morning.    Dispense:  30 capsule    Refill:  0  . amphetamine-dextroamphetamine (ADDERALL XR) 30 MG 24 hr capsule    Sig: Take 1 capsule (30 mg total) by mouth every morning. For 30 days after signed    Dispense:  30 capsule    Refill:  0    I have completed the patient encounter in its entirety as documented by the scribe, with editing by me where necessary. Robert P. Laney Pastor, M.D.

## 2015-04-28 ENCOUNTER — Encounter: Payer: Self-pay | Admitting: Internal Medicine

## 2015-12-30 ENCOUNTER — Other Ambulatory Visit: Payer: Self-pay

## 2015-12-31 ENCOUNTER — Other Ambulatory Visit: Payer: Self-pay

## 2015-12-31 NOTE — Telephone Encounter (Signed)
Received a req to RF topamax from a pharm in [place]. We haven't seen pt in over a year. I faxed back denial w/note that pt needs to est w/a provider in [place] if she is living there now.

## 2015-12-31 NOTE — Telephone Encounter (Signed)
See notes under other refill req.

## 2020-08-21 ENCOUNTER — Ambulatory Visit (HOSPITAL_COMMUNITY)
Admission: EM | Admit: 2020-08-21 | Discharge: 2020-08-21 | Disposition: A | Discharge status: Home / Self Care | Payer: Medicaid Other | Attending: Internal Medicine | Admitting: Internal Medicine

## 2020-08-21 ENCOUNTER — Ambulatory Visit (INDEPENDENT_AMBULATORY_CARE_PROVIDER_SITE_OTHER): Payer: Medicaid Other

## 2020-08-21 ENCOUNTER — Encounter (HOSPITAL_COMMUNITY): Payer: Self-pay | Admitting: Emergency Medicine

## 2020-08-21 ENCOUNTER — Other Ambulatory Visit: Payer: Self-pay

## 2020-08-21 DIAGNOSIS — R109 Unspecified abdominal pain: Secondary | ICD-10-CM | POA: Insufficient documentation

## 2020-08-21 DIAGNOSIS — R0781 Pleurodynia: Secondary | ICD-10-CM | POA: Diagnosis present

## 2020-08-21 DIAGNOSIS — M545 Low back pain, unspecified: Secondary | ICD-10-CM

## 2020-08-21 DIAGNOSIS — M546 Pain in thoracic spine: Secondary | ICD-10-CM | POA: Insufficient documentation

## 2020-08-21 LAB — POCT URINALYSIS DIPSTICK, ED / UC
Bilirubin Urine: NEGATIVE
Glucose, UA: NEGATIVE mg/dL
Hgb urine dipstick: NEGATIVE
Ketones, ur: NEGATIVE mg/dL
Leukocytes,Ua: NEGATIVE
Nitrite: NEGATIVE
Protein, ur: NEGATIVE mg/dL
Specific Gravity, Urine: 1.01 (ref 1.005–1.030)
Urobilinogen, UA: 0.2 mg/dL (ref 0.0–1.0)
pH: 6.5 (ref 5.0–8.0)

## 2020-08-21 LAB — COMPREHENSIVE METABOLIC PANEL
ALT: 16 U/L (ref 0–44)
AST: 22 U/L (ref 15–41)
Albumin: 4.4 g/dL (ref 3.5–5.0)
Alkaline Phosphatase: 58 U/L (ref 38–126)
Anion gap: 8 (ref 5–15)
BUN: 7 mg/dL (ref 6–20)
CO2: 27 mmol/L (ref 22–32)
Calcium: 9.6 mg/dL (ref 8.9–10.3)
Chloride: 105 mmol/L (ref 98–111)
Creatinine, Ser: 0.86 mg/dL (ref 0.44–1.00)
GFR, Estimated: >60 mL/min (ref >60)
Glucose, Bld: 101 mg/dL — ABNORMAL HIGH (ref 70–99)
Potassium: 4.2 mmol/L (ref 3.5–5.1)
Sodium: 140 mmol/L (ref 135–145)
Total Bilirubin: 0.9 mg/dL (ref 0.3–1.2)
Total Protein: 7.9 g/dL (ref 6.5–8.1)

## 2020-08-21 LAB — POC URINE PREG, ED: Preg Test, Ur: NEGATIVE

## 2020-08-21 LAB — CBC
HCT: 45.1 % (ref 36.0–46.0)
Hemoglobin: 15.5 g/dL — ABNORMAL HIGH (ref 12.0–15.0)
MCH: 30.9 pg (ref 26.0–34.0)
MCHC: 34.4 g/dL (ref 30.0–36.0)
MCV: 90 fL (ref 80.0–100.0)
Platelets: 164 10*3/uL (ref 150–400)
RBC: 5.01 MIL/uL (ref 3.87–5.11)
RDW: 12.2 % (ref 11.5–15.5)
WBC: 9.1 10*3/uL (ref 4.0–10.5)
nRBC: 0 % (ref 0.0–0.2)

## 2020-08-21 MED ORDER — IBUPROFEN 600 MG PO TABS: 600.0000 mg | ORAL_TABLET | Freq: Three times a day (TID) | ORAL | 0 refills | Status: DC

## 2020-08-21 MED ORDER — CYCLOBENZAPRINE HCL 10 MG PO TABS: 10.0000 mg | ORAL_TABLET | Freq: Every day | ORAL | 0 refills | Status: DC

## 2020-08-21 NOTE — ED Provider Notes (Signed)
[facility]    CSN: [id number] Arrival date & time: 08/21/20  1017      History   Chief Complaint Chief Complaint  Patient presents with  . back     HPI Lydia Jensen is a 35 y.o. female.   Patient presents with right flank and mid back pain 8/10 beginning 3-6 months ago. Described as constant, worsened when lying flat making it difficult to sleep. Does not radiate, denies numbness and tingling. ROM intact but can be felt when twisting and turning. C/o intermittent shortness of breath at rest but not with exertion, chest tightness and pain with deep breathing beginning 2 weeks ago. Has become an everyday occurrence. Intermittent tachycardia per apple watch, can feel heart racing at times at rest. Highest HR seen 130s. Denies palpitations. Has not attempted any treatment. No known cardiac history. History of ADHD, kidney stones. Currently breastfeeding.   History reviewed. No pertinent past medical history.  Patient Active Problem List   Diagnosis Date Noted  . Migraine headache 12/27/2014  . Bereavement--loss of father to etohism 2015 12/27/2014  . Attention deficit disorder 10/20/2012  . Idiopathic scoliosis 10/20/2012    Past Surgical History:  Procedure Laterality Date  . APPENDECTOMY      OB History   No obstetric history on file.      Home Medications    Prior to Admission medications   Medication Sig Start Date End Date Taking? Authorizing Provider  amphetamine-dextroamphetamine (ADDERALL XR) 30 MG 24 hr capsule Take 1 capsule (30 mg total) by mouth every morning. For 60 days after signed 12/27/14   Leandrew Koyanagi, MD  amphetamine-dextroamphetamine (ADDERALL XR) 30 MG 24 hr capsule Take 1 capsule (30 mg total) by mouth every morning. 12/27/14   Leandrew Koyanagi, MD  amphetamine-dextroamphetamine (ADDERALL XR) 30 MG 24 hr capsule Take 1 capsule (30 mg total) by mouth every morning. For 30 days after signed 12/27/14   Leandrew Koyanagi, MD   amphetamine-dextroamphetamine (ADDERALL) 15 MG tablet Take 1 tablet by mouth daily. For 60 days after signed 12/27/14   Leandrew Koyanagi, MD  amphetamine-dextroamphetamine (ADDERALL) 15 MG tablet Take 1 tablet by mouth daily. For 30 days after signed 12/27/14   Leandrew Koyanagi, MD  amphetamine-dextroamphetamine (ADDERALL) 15 MG tablet Take 1 tablet by mouth daily. 12/27/14   Leandrew Koyanagi, MD  drospirenone-ethinyl estradiol Sherrill Raring) 3-0.02 MG tablet Take 1 tablet by mouth daily. 12/27/14   Leandrew Koyanagi, MD  topiramate (TOPAMAX) 100 MG tablet Take 1 tablet (100 mg total) by mouth daily. 12/27/14   Leandrew Koyanagi, MD    Family History Family History  Problem Relation Age of Onset  . Cancer Mother     Social History Social History   Tobacco Use  . Smoking status: Never Smoker  . Smokeless tobacco: Never Used  Substance Use Topics  . Alcohol use: No  . Drug use: No     Allergies   Patient has no known allergies.   Review of Systems Review of Systems  Constitutional: Negative.   HENT: Negative.   Respiratory: Positive for chest tightness and shortness of breath. Negative for apnea, cough, choking, wheezing and stridor.   Cardiovascular: Negative.   Gastrointestinal: Negative.   Endocrine: Negative.   Genitourinary: Positive for flank pain and frequency. Negative for decreased urine volume, difficulty urinating, dyspareunia, dysuria, enuresis, genital sores, hematuria, menstrual problem, pelvic pain, urgency, vaginal bleeding, vaginal discharge and vaginal pain.  Musculoskeletal: Positive for back pain.  Negative for arthralgias, gait problem, joint swelling, myalgias, neck pain and neck stiffness.  Skin: Negative.   Neurological: Negative.   Psychiatric/Behavioral: Negative.      Physical Exam Triage Vital Signs ED Triage Vitals  Enc Vitals Group     BP 08/21/20 1059 129/88     Pulse Rate 08/21/20 1059 91     Resp 08/21/20 1059 18      Temp 08/21/20 1059 99.1 F (37.3 C)     Temp Source 08/21/20 1059 Oral     SpO2 08/21/20 1059 100 %     Weight --      Height --      Head Circumference --      Peak Flow --      Pain Score 08/21/20 1054 8     Pain Loc --      Pain Edu? --      Excl. in [place]? --    No data found.  Updated Vital Signs BP 129/88 (BP Location: Right Arm)   Pulse 91   Temp 99.1 F (37.3 C) (Oral)   Resp 18   LMP 07/31/2020   SpO2 100%   Visual Acuity Right Eye Distance:   Left Eye Distance:   Bilateral Distance:    Right Eye Near:   Left Eye Near:    Bilateral Near:     Physical Exam Constitutional:      Appearance: Normal appearance. She is normal weight.  HENT:     Head: Normocephalic.  Eyes:     Extraocular Movements: Extraocular movements intact.  Cardiovascular:     Rate and Rhythm: Normal rate and regular rhythm.     Pulses: Normal pulses.     Heart sounds: Normal heart sounds.  Pulmonary:     Effort: Pulmonary effort is normal.     Breath sounds: Normal breath sounds.  Abdominal:     General: Abdomen is flat. Bowel sounds are normal.     Tenderness: There is abdominal tenderness in the right upper quadrant. There is no right CVA tenderness or left CVA tenderness.  Musculoskeletal:     Cervical back: Normal and normal range of motion.     Lumbar back: Normal.       Back:     Comments: Tenderness present from mid back, right flank and over front of right rib cage   Skin:    General: Skin is warm and dry.  Neurological:     Mental Status: She is alert and oriented to person, place, and time. Mental status is at baseline.  Psychiatric:        Mood and Affect: Mood normal.        Behavior: Behavior normal.        Thought Content: Thought content normal.        Judgment: Judgment normal.      UC Treatments / Results  Labs (all labs ordered are listed, but only abnormal results are displayed) Labs Reviewed - No data to display  EKG   Radiology No results  found.  Procedures Procedures (including critical care time)  Medications Ordered in UC Medications - No data to display  Initial Impression / Assessment and Plan / UC Course  I have reviewed the triage vital signs and the nursing notes.  Pertinent labs & imaging results that were available during my care of the patient were reviewed by me and considered in my medical decision making (see chart for details).    1. Chest x-ray- negative  2. Urinalysis- negative 3. Urine pregnancy- negative   4. Flexeril 10 mg at bedtime as needed 5. Ibuprofen 600 mg tid  6. Gentle ROM stretches as tolerated 7. Heating pad 15 minute intervals  8. Go to ED for worsening respiratory symptoms, chest pain, fever, chills, abdominal pain, changes in urine or bowel habits  Final Clinical Impressions(s) / UC Diagnoses   Final diagnoses:  None   Discharge Instructions   None    ED Prescriptions    None     PDMP not reviewed this encounter.   Hans Eden, NP 08/21/20 1220

## 2020-08-21 NOTE — Discharge Instructions (Signed)
Lab results pending 24 hours, you will be called if anything is of concern   Can use muscle relaxer before bed as needed  Can use ibuprofen three times a day   Gentle stretching exercises as tolerated  Can use heating pad 15 minute intervals   Follow up with PCP for persistent pain, go to emergency department for worsening shortness of breath, abdominal pain, back pain, deep breaths, persistent high heart rate, fever, chills, blood in stool

## 2020-08-21 NOTE — ED Triage Notes (Addendum)
Right side/mid back pain that started a few months ago.  Pain is worsening over the past week.  Pain is interrupting sleep.  Denies any known injury at onset of pain.  Sometimes hard to take a deep breath and feels tightness.  Feels pain with deep breaths , but does not make it hurt worse

## 2021-05-13 DIAGNOSIS — M25562 Pain in left knee: Secondary | ICD-10-CM | POA: Diagnosis not present

## 2021-05-13 DIAGNOSIS — M545 Low back pain, unspecified: Secondary | ICD-10-CM | POA: Diagnosis not present

## 2021-05-29 DIAGNOSIS — M25562 Pain in left knee: Secondary | ICD-10-CM | POA: Diagnosis not present

## 2021-06-18 DIAGNOSIS — S83249A Other tear of medial meniscus, current injury, unspecified knee, initial encounter: Secondary | ICD-10-CM | POA: Insufficient documentation

## 2021-06-18 DIAGNOSIS — S83242A Other tear of medial meniscus, current injury, left knee, initial encounter: Secondary | ICD-10-CM | POA: Diagnosis not present

## 2021-07-04 IMAGING — DX DG CHEST 2V
2 series · 2 of 2 positions shown · non-contrast
Comparison: None.

CLINICAL DATA: Back pain

EXAM:
CHEST - 2 VIEW

[chest pa]
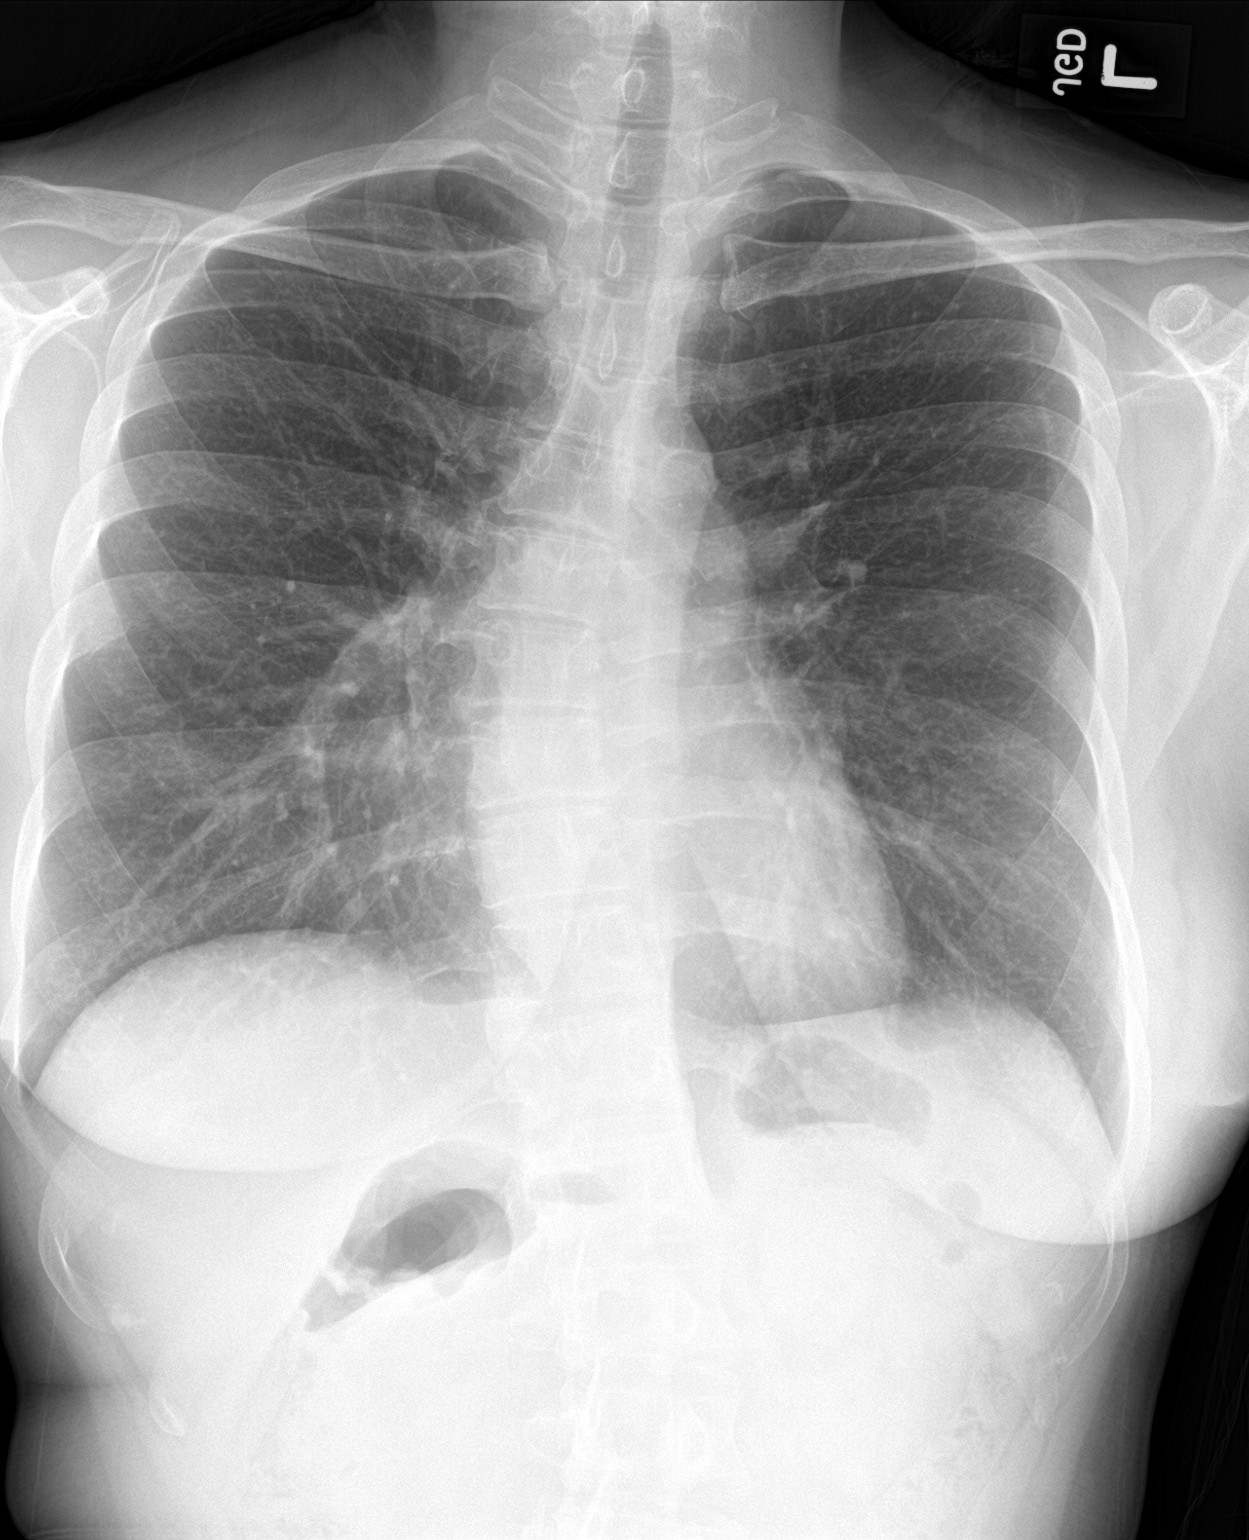

[chest lat]
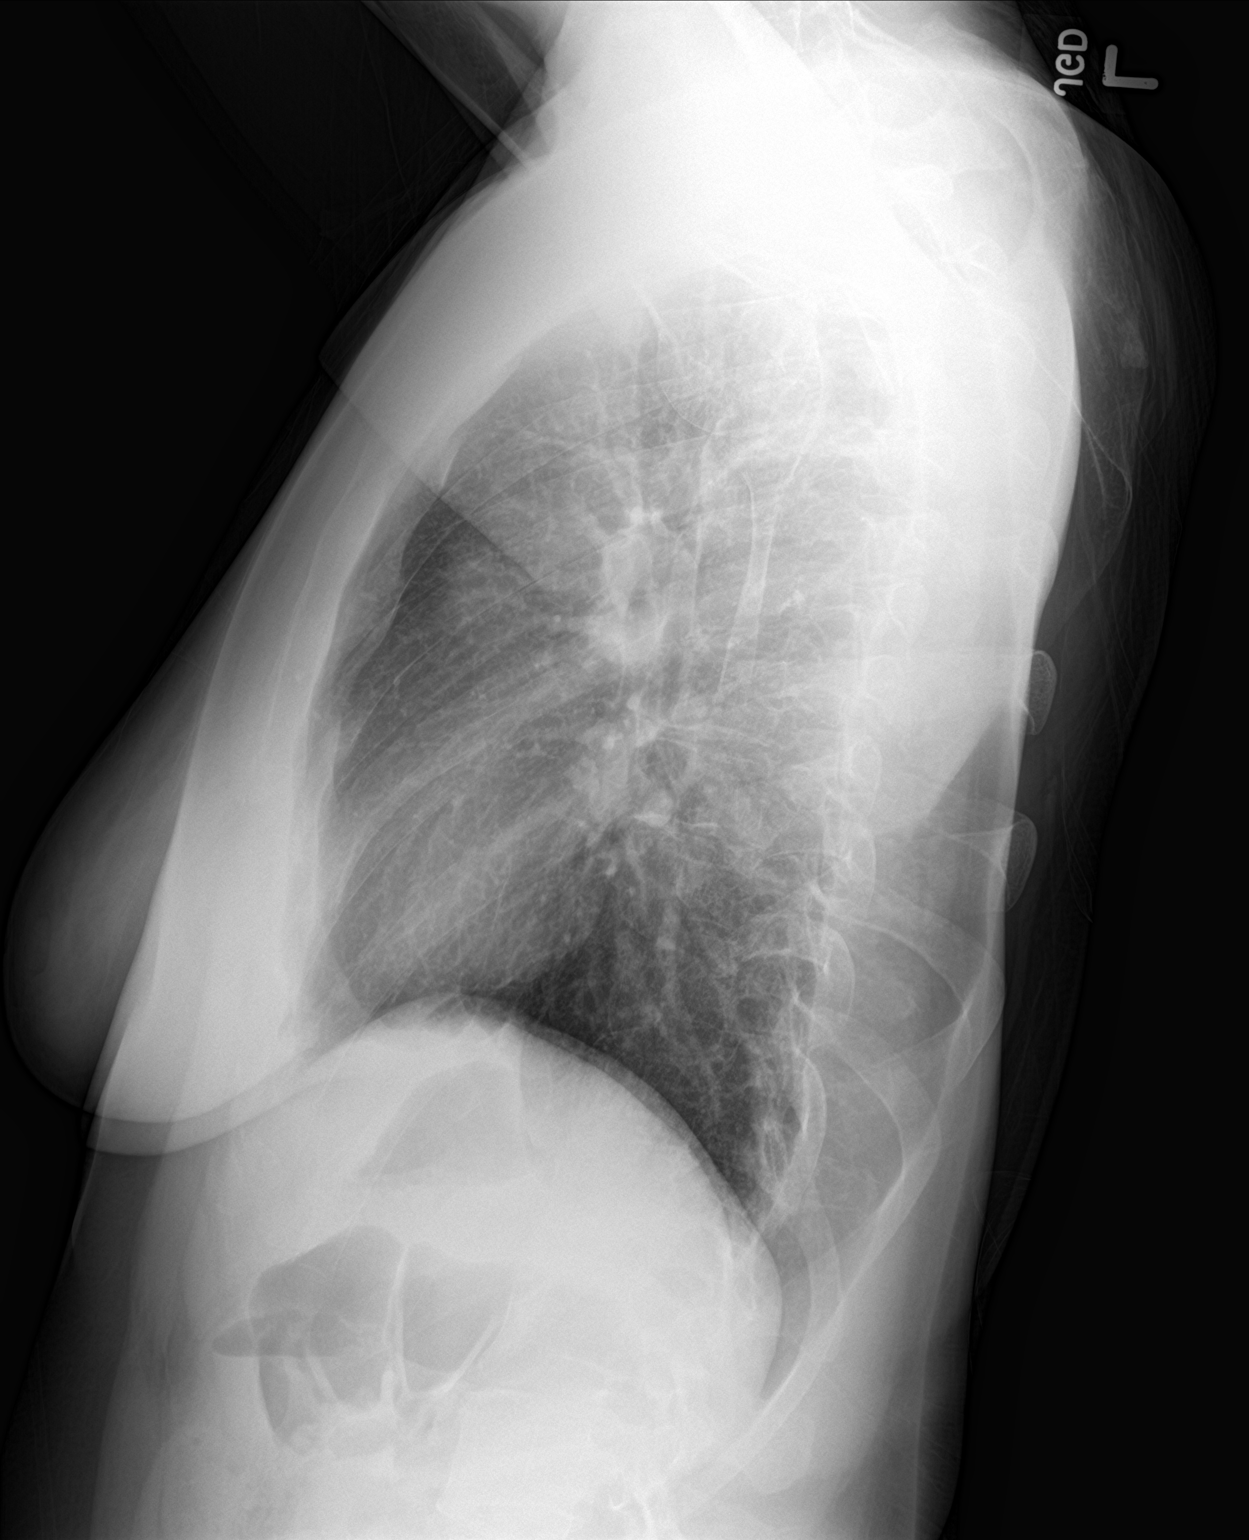

[2 of 2 positions shown; findings below may reference images not displayed]

FINDINGS: Cardiac shadows within normal limits. The lungs are well aerated
bilaterally. No focal infiltrate or sizable effusion is seen. Mild
S-shaped scoliosis of the thoracolumbar spine is noted.
IMPRESSION: No acute abnormality noted.

## 2021-09-08 DIAGNOSIS — G8918 Other acute postprocedural pain: Secondary | ICD-10-CM | POA: Diagnosis not present

## 2021-09-08 DIAGNOSIS — M23222 Derangement of posterior horn of medial meniscus due to old tear or injury, left knee: Secondary | ICD-10-CM | POA: Diagnosis not present

## 2021-09-08 DIAGNOSIS — M23322 Other meniscus derangements, posterior horn of medial meniscus, left knee: Secondary | ICD-10-CM | POA: Diagnosis not present

## 2021-12-07 ENCOUNTER — Ambulatory Visit (INDEPENDENT_AMBULATORY_CARE_PROVIDER_SITE_OTHER): Payer: Medicaid Other

## 2021-12-07 ENCOUNTER — Encounter (HOSPITAL_COMMUNITY): Payer: Self-pay | Admitting: *Deleted

## 2021-12-07 ENCOUNTER — Other Ambulatory Visit: Payer: Self-pay

## 2021-12-07 ENCOUNTER — Ambulatory Visit (HOSPITAL_COMMUNITY)
Admission: EM | Admit: 2021-12-07 | Discharge: 2021-12-07 | Disposition: A | Discharge status: Home / Self Care | Payer: Medicaid Other | Attending: Family Medicine | Admitting: Family Medicine

## 2021-12-07 DIAGNOSIS — M545 Low back pain, unspecified: Secondary | ICD-10-CM

## 2021-12-07 LAB — POC URINE PREG, ED: Preg Test, Ur: NEGATIVE

## 2021-12-07 LAB — POCT URINALYSIS DIPSTICK, ED / UC
Bilirubin Urine: NEGATIVE
Glucose, UA: NEGATIVE mg/dL
Ketones, ur: NEGATIVE mg/dL
Leukocytes,Ua: NEGATIVE
Nitrite: NEGATIVE
Protein, ur: NEGATIVE mg/dL
Specific Gravity, Urine: 1.025 (ref 1.005–1.030)
Urobilinogen, UA: 0.2 mg/dL (ref 0.0–1.0)
pH: 6 (ref 5.0–8.0)

## 2021-12-07 MED ORDER — KETOROLAC TROMETHAMINE 30 MG/ML IJ SOLN
30.0000 mg | Freq: Once | INTRAMUSCULAR | Status: AC
  Administered 2021-12-07: 30 mg via INTRAMUSCULAR

## 2021-12-07 MED ORDER — TAMSULOSIN HCL 0.4 MG PO CAPS: 0.4000 mg | ORAL_CAPSULE | Freq: Every day | ORAL | 0 refills | Status: DC

## 2021-12-07 MED ORDER — NAPROXEN 500 MG PO TABS: 500.0000 mg | ORAL_TABLET | Freq: Two times a day (BID) | ORAL | 0 refills | Status: DC | PRN

## 2021-12-07 MED ORDER — KETOROLAC TROMETHAMINE 30 MG/ML IJ SOLN
INTRAMUSCULAR | Status: AC
  Filled 2021-12-07: qty 1

## 2021-12-07 NOTE — ED Triage Notes (Signed)
PT reports lower back pain started last night.

## 2021-12-07 NOTE — ED Provider Notes (Signed)
Lydia Jensen    CSN: [id number] Arrival date & time: 12/07/21  1048      History   Chief Complaint Chief Complaint  Patient presents with   Back Pain    HPI Lydia Jensen is a 36 y.o. female.    Back Pain  Here with low back pain that began suddenly last night about midnight.  She states it is mostly all the way across but may be a little more on the right.  It does not seem to actually radiate but she does have a little pelvic discomfort, which she thinks maybe is due to her menses about to start.  Last normal menstrual cycle was about 28 days ago.  No fever or chills.  No dysuria.  No rash    History reviewed. No pertinent past medical history.  Patient Active Problem List   Diagnosis Date Noted   Migraine headache 12/27/2014   Bereavement--loss of father to etohism 2015 12/27/2014   Attention deficit disorder 10/20/2012   Idiopathic scoliosis 10/20/2012    Past Surgical History:  Procedure Laterality Date   APPENDECTOMY      OB History   No obstetric history on file.      Home Medications    Prior to Admission medications   Medication Sig Start Date End Date Taking? Authorizing Provider  naproxen (NAPROSYN) 500 MG tablet Take 1 tablet (500 mg total) by mouth every 12 (twelve) hours as needed (pain). 12/07/21  Yes Barrett Henle, MD  tamsulosin (FLOMAX) 0.4 MG CAPS capsule Take 1 capsule (0.4 mg total) by mouth daily. 12/07/21  Yes Barrett Henle, MD  amphetamine-dextroamphetamine (ADDERALL XR) 30 MG 24 hr capsule Take 1 capsule (30 mg total) by mouth every morning. For 30 days after signed 12/27/14 08/21/20  Leandrew Koyanagi, MD  drospirenone-ethinyl estradiol Sherrill Raring) 3-0.02 MG tablet Take 1 tablet by mouth daily. 12/27/14 08/21/20  Leandrew Koyanagi, MD  topiramate (TOPAMAX) 100 MG tablet Take 1 tablet (100 mg total) by mouth daily. 12/27/14 08/21/20  Leandrew Koyanagi, MD    Family History Family History  Problem  Relation Age of Onset   Cancer Mother     Social History Social History   Tobacco Use   Smoking status: Never   Smokeless tobacco: Never  Substance Use Topics   Alcohol use: No   Drug use: No     Allergies   Patient has no known allergies.   Review of Systems Review of Systems  Musculoskeletal:  Positive for back pain.     Physical Exam Triage Vital Signs ED Triage Vitals  Enc Vitals Group     BP 12/07/21 1139 126/82     Pulse Rate 12/07/21 1139 69     Resp 12/07/21 1139 18     Temp 12/07/21 1139 98.7 F (37.1 C)     Temp src --      SpO2 12/07/21 1139 98 %     Weight --      Height --      Head Circumference --      Peak Flow --      Pain Score 12/07/21 1137 10     Pain Loc --      Pain Edu? --      Excl. in [place]? --    No data found.  Updated Vital Signs BP 126/82   Pulse 69   Temp 98.7 F (37.1 C)   Resp 18   LMP 11/08/2021   SpO2  98%   Visual Acuity Right Eye Distance:   Left Eye Distance:   Bilateral Distance:    Right Eye Near:   Left Eye Near:    Bilateral Near:     Physical Exam Vitals reviewed.  Constitutional:      Appearance: She is not ill-appearing, toxic-appearing or diaphoretic.     Comments: Sitting quite still on the exam table when I enter the room.  It appears difficult for her to rise to stand from the sitting position  HENT:     Mouth/Throat:     Mouth: Mucous membranes are moist.  Eyes:     Extraocular Movements: Extraocular movements intact.     Pupils: Pupils are equal, round, and reactive to light.  Cardiovascular:     Rate and Rhythm: Normal rate and regular rhythm.     Heart sounds: No murmur heard. Pulmonary:     Effort: Pulmonary effort is normal.     Breath sounds: Normal breath sounds.  Abdominal:     Palpations: Abdomen is soft.     Tenderness: There is abdominal tenderness (suprapubic).  Musculoskeletal:     Cervical back: Neck supple.  Lymphadenopathy:     Cervical: No cervical adenopathy.   Skin:    Coloration: Skin is not jaundiced or pale.  Neurological:     General: No focal deficit present.     Mental Status: She is alert and oriented to person, place, and time.  Psychiatric:        Behavior: Behavior normal.      UC Treatments / Results  Labs (all labs ordered are listed, but only abnormal results are displayed) Labs Reviewed  POCT URINALYSIS DIPSTICK, ED / UC - Abnormal; Notable for the following components:      Result Value   Hgb urine dipstick MODERATE (*)    All other components within normal limits  POC URINE PREG, ED    EKG   Radiology DG Lumbar Spine 2-3 Views  Result Date: 12/07/2021 CLINICAL DATA:  Low back pain, no reported injury. EXAM: LUMBAR SPINE - 2-3 VIEW COMPARISON:  None Available. FINDINGS: Levoconvex rotatory scoliosis, centered at L2. Alignment is otherwise anatomic. Vertebral body and disc space height are maintained. No degenerative changes. IMPRESSION: 1. No acute degenerative changes. 2. Levoconvex rotatory scoliosis. Electronically Signed   By: Lorin Picket M.D.   On: 12/07/2021 12:51    Procedures Procedures (including critical care time)  Medications Ordered in UC Medications  ketorolac (TORADOL) 30 MG/ML injection 30 mg (has no administration in time range)    Initial Impression / Assessment and Plan / UC Course  I have reviewed the triage vital signs and the nursing notes.  Pertinent labs & imaging results that were available during my care of the patient were reviewed by me and considered in my medical decision making (see chart for details).     UPT is negative.  X-rays show scoliosis.  Urinalysis shows some blood  I am going to treat the pain and also the possibility of renal stone.  Request is made to help her find a primary care and she is given contact information for urology in case she needs Final Clinical Impressions(s) / UC Diagnoses   Final diagnoses:  Acute bilateral low back pain without sciatica      Discharge Instructions      You have been given a shot of Toradol 30 mg today.  Take naproxen 500 mg--1 tablet 2 times daily as needed for pain  Take tamsulosin 0.4 mg--1 daily.  This is to help urinary flow in case this is a kidney stone causing your pain  Use the website at the back of your summary paperwork to self schedule a new patient appointment     ED Prescriptions     Medication Sig Dispense Auth. Provider   naproxen (NAPROSYN) 500 MG tablet Take 1 tablet (500 mg total) by mouth every 12 (twelve) hours as needed (pain). 30 tablet Paysen Goza, Gwenlyn Perking, MD   tamsulosin (FLOMAX) 0.4 MG CAPS capsule Take 1 capsule (0.4 mg total) by mouth daily. 30 capsule Barrett Henle, MD      I have reviewed the PDMP during this encounter.   Barrett Henle, MD 12/07/21 [phone]

## 2021-12-07 NOTE — Discharge Instructions (Signed)
You have been given a shot of Toradol 30 mg today.  Take naproxen 500 mg--1 tablet 2 times daily as needed for pain  Take tamsulosin 0.4 mg--1 daily.  This is to help urinary flow in case this is a kidney stone causing your pain  Use the website at the back of your summary paperwork to self schedule a new patient appointment

## 2021-12-09 ENCOUNTER — Encounter: Payer: Self-pay | Admitting: Emergency Medicine

## 2021-12-21 ENCOUNTER — Encounter: Payer: BLUE CROSS/BLUE SHIELD | Admitting: Radiology

## 2022-02-11 ENCOUNTER — Ambulatory Visit: Payer: Medicaid Other | Admitting: Pediatrics

## 2022-04-29 ENCOUNTER — Telehealth: Payer: Self-pay

## 2022-04-30 ENCOUNTER — Ambulatory Visit: Payer: Medicaid Other | Admitting: Pediatrics

## 2022-05-06 NOTE — Telephone Encounter (Signed)
Message to patient.

## 2022-06-28 ENCOUNTER — Other Ambulatory Visit: Payer: Self-pay | Admitting: Nurse Practitioner

## 2022-06-28 ENCOUNTER — Telehealth: Payer: Medicaid Other | Admitting: Nurse Practitioner

## 2022-06-28 ENCOUNTER — Telehealth: Payer: Self-pay | Admitting: Critical Care Medicine

## 2022-06-28 DIAGNOSIS — L309 Dermatitis, unspecified: Secondary | ICD-10-CM

## 2022-06-28 MED ORDER — TRIAMCINOLONE ACETONIDE 0.025 % EX OINT: 1.0000 | TOPICAL_OINTMENT | Freq: Two times a day (BID) | CUTANEOUS | 0 refills | Status: DC

## 2022-06-28 MED ORDER — TRIAMCINOLONE ACETONIDE 0.025 % EX CREA: 1.0000 | TOPICAL_CREAM | Freq: Two times a day (BID) | CUTANEOUS | 0 refills | Status: DC

## 2022-06-28 NOTE — Progress Notes (Signed)
I have spent 5 minutes in review of e-visit questionnaire, review and updating patient chart, medical decision making and response to patient.  ° °Eimi Viney W Sanders Manninen, NP ° °  °

## 2022-06-28 NOTE — Telephone Encounter (Signed)
Call from [facility]  pharmacy does not stock ointment I authorized RX for cream as that is in stock   No other ?s or concerns

## 2022-06-28 NOTE — Progress Notes (Signed)

## 2022-06-29 ENCOUNTER — Other Ambulatory Visit: Payer: Self-pay

## 2022-06-29 ENCOUNTER — Ambulatory Visit (INDEPENDENT_AMBULATORY_CARE_PROVIDER_SITE_OTHER): Payer: Medicaid Other | Admitting: Gastroenterology

## 2022-06-29 ENCOUNTER — Encounter: Payer: Self-pay | Admitting: Gastroenterology

## 2022-06-29 VITALS — BP 148/86 | HR 94 | Temp 98.6°F | Ht 66.0 in | Wt 161.2 lb

## 2022-06-29 DIAGNOSIS — K921 Melena: Secondary | ICD-10-CM

## 2022-06-29 DIAGNOSIS — L290 Pruritus ani: Secondary | ICD-10-CM

## 2022-06-29 DIAGNOSIS — R194 Change in bowel habit: Secondary | ICD-10-CM | POA: Diagnosis not present

## 2022-06-29 MED ORDER — NA SULFATE-K SULFATE-MG SULF 17.5-3.13-1.6 GM/177ML PO SOLN: 354.0000 mL | Freq: Once | ORAL | 0 refills | Status: AC

## 2022-06-29 NOTE — Patient Instructions (Addendum)
Please remember to cancel your appointment if you feel better.  Nonsurgical Procedures for Hemorrhoids, Care After This sheet gives you information about how to care for yourself after your procedure. Your health care provider may also give you more specific instructions. If you have problems or questions, contact your health care provider. What can I expect after the procedure? After the procedure, it is common to have: Slight rectal bleeding for a few days. Soreness or a dull ache in the rectal area. Follow these instructions at home: Medicines Take over-the-counter and prescription medicines only as told by your health care provider. Use a stool softener or a bulk laxative as told by your health care provider. Activity  Return to your normal activities as told by your health care provider. Ask your health care provider what activities are safe for you. Do not lift anything that is heavier than 10 lb (4.5 kg), or the limit that you are told, until your health care provider says that it is safe. Do not sit for long periods of time without moving. Take a walk every day or as told by your health care provider. Managing pain and swelling Take warm sitz baths for 20 minutes, 3-4 times a day to ease pain and discomfort. You may do this in a bathtub or using a portable sitz bath that fits over the toilet. If directed, apply ice to the affected area. Using ice packs between sitz baths may be helpful. Put ice in a plastic bag. Place a towel between your skin and the bag. Leave the ice on for 20 minutes, 2-3 times a day. Eating and drinking  Eat foods that have a lot of fiber in them, such as whole grains, beans, nuts, fruits, and vegetables. Drink enough fluid to keep your urine pale yellow. General instructions Do not strain to have a bowel movement. Do not spend a long time sitting on the toilet. Keep all follow-up visits as told by your health care provider. This is important. Contact a  health care provider if: Your pain medicine is not helping. You have a fever. You become constipated. You continue to have light rectal bleeding for more than a few days. You are unable to pass urine (urinary retention). Get help right away if you have: Very bad rectal pain. Heavy bleeding from your rectum. Summary After the procedure, it is common to have slight rectal bleeding and soreness in the area. Taking warm sitz baths and applying ice may be helpful to relieve the discomfort. Eat foods that have a lot of fiber in them, such as whole grains, beans, nuts, fruits, and vegetables. Get help right away if you have excessive pain or heavy bleeding from your rectum. This information is not intended to replace advice given to you by your health care provider. Make sure you discuss any questions you have with your health care provider. Document Revised: 11/26/2020 Document Reviewed: 11/26/2020 Elsevier Patient Education  [place].

## 2022-06-29 NOTE — Progress Notes (Signed)
Jonathon Bellows MD, MRCP(U.K) [address]  [place]  [place], [place] [address]  Main: [phone]  Fax: [phone]   Gastroenterology Consultation  Referring Provider:     No ref. provider found Primary Care Physician:  Patient, No Pcp Per Primary Gastroenterologist:  Dr. Jonathon Bellows  Reason for Consultation:     Hemorrhoids        HPI:   Lydia Jensen is a 37 y.o. y/o female referred for hemorrhoids.  She thinks that she has had for over a month perianal itching discomfort associated with a bowel movement also noted some blood in his stools felt like a pressure and incomplete defecation.  Denies any change in shape of his stool no family history of colon cancer or polyps.  Tried sitz bath at home which has not helped. No past medical history on file.  Past Surgical History:  Procedure Laterality Date   APPENDECTOMY      Prior to Admission medications   Medication Sig Start Date End Date Taking? Authorizing Provider  triamcinolone (KENALOG) 0.025 % cream Apply 1 Application topically 2 (two) times daily. 06/28/22   Gildardo Pounds, NP  ALPRAZolam Duanne Moron) 1 MG tablet Take 1 mg by mouth 3 (three) times daily as needed.    [provider]  aspirin EC 81 MG tablet Take 81 mg by mouth daily. 09/02/21   [provider]  meloxicam (MOBIC) 7.5 MG tablet Take 7.5 mg by mouth daily.    [provider]  methocarbamol (ROBAXIN) 500 MG tablet Take 500 mg by mouth.    [provider]  naproxen (NAPROSYN) 500 MG tablet Take 1 tablet (500 mg total) by mouth every 12 (twelve) hours as needed (pain). 12/07/21   Barrett Henle, MD  oxyCODONE-acetaminophen (PERCOCET/ROXICET) 5-325 MG tablet Take 1 tablet by mouth every 4 (four) hours as needed. 05/03/13   [provider]  tamsulosin (FLOMAX) 0.4 MG CAPS capsule Take 1 capsule (0.4 mg total) by mouth daily. 12/07/21   Barrett Henle, MD  triamcinolone (KENALOG) 0.025 % ointment Apply 1  Application topically 2 (two) times daily. 06/28/22   Gildardo Pounds, NP  amphetamine-dextroamphetamine (ADDERALL XR) 30 MG 24 hr capsule Take 1 capsule (30 mg total) by mouth every morning. For 30 days after signed 12/27/14 08/21/20  Leandrew Koyanagi, MD  drospirenone-ethinyl estradiol Sherrill Raring) 3-0.02 MG tablet Take 1 tablet by mouth daily. 12/27/14 08/21/20  Leandrew Koyanagi, MD  topiramate (TOPAMAX) 100 MG tablet Take 1 tablet (100 mg total) by mouth daily. 12/27/14 08/21/20  Leandrew Koyanagi, MD    Family History  Problem Relation Age of Onset   Cancer Mother      Social History   Tobacco Use   Smoking status: Never   Smokeless tobacco: Never  Substance Use Topics   Alcohol use: No   Drug use: No    Allergies as of 06/29/2022   (No Known Allergies)    Review of Systems:    All systems reviewed and negative except where noted in HPI.   Physical Exam:  BP (!) 148/86   Pulse 94   Temp 98.6 F (37 C) (Oral)   Ht '5\' 6"'$  (1.676 m)   Wt 161 lb 3.2 oz (73.1 kg)   BMI 26.02 kg/m  No LMP recorded. Psych:  Alert and cooperative. Normal mood and affect. General:   Alert,  Well-developed, well-nourished, pleasant and cooperative in NAD Head:  Normocephalic and atraumatic. Eyes:  Sclera  clear, no icterus.   Conjunctiva pink. Ears:  Normal auditory acuity.    Neurologic:  Alert and oriented x3;  grossly normal neurologically. Psych:  Alert and cooperative. Normal mood and affect.  Imaging Studies: No results found.  Assessment and Plan:   Lydia Jensen is a 37 y.o. y/o female has been referred for issues concerning for hemorrhoids her symptoms consist of bleeding with droplets of blood in the toilet bowl as well as on the tissue paper, sense of incomplete defecation, pressure in the perianal area itching.  I explained to her that the symptoms could be related to hemorrhoids but other conditions such as large polyps occasionally cancer can also mimic and hence  we will need to evaluate first to rule out any colonic pathology and if negative we will treat for hemorrhoids.  In the interim discussed about conservative management of internal hemorrhoids including perianal toileting and hygiene use of a high-fiber diet,  patient information provided,  if it resolves with this will need no further definitive hemorrhoidal therapy but if it does not help , we will need banding of the hemorrhoids at our office if seen during colonoscopy which we will schedule shortly   I have discussed alternative options, risks & benefits,  which include, but are not limited to, bleeding, infection, perforation,respiratory complication & drug reaction.  The patient agrees with this plan & written consent will be obtained.     Follow up in 8 to 12 weeks  Dr Jonathon Bellows MD,MRCP(U.K)

## 2022-07-05 ENCOUNTER — Encounter: Payer: Self-pay | Admitting: Gastroenterology

## 2022-07-06 ENCOUNTER — Encounter: Payer: Self-pay | Admitting: Gastroenterology

## 2022-07-06 ENCOUNTER — Ambulatory Visit
Admission: RE | Admit: 2022-07-06 | Discharge: 2022-07-06 | Disposition: A | Discharge status: Home / Self Care | Payer: Medicaid Other | Attending: Gastroenterology | Admitting: Gastroenterology

## 2022-07-06 ENCOUNTER — Ambulatory Visit: Payer: Medicaid Other | Admitting: Certified Registered"

## 2022-07-06 ENCOUNTER — Encounter
Admission: RE | Disposition: A | Discharge status: Home / Self Care | Payer: Self-pay | Source: Home / Self Care | Attending: Gastroenterology

## 2022-07-06 DIAGNOSIS — K640 First degree hemorrhoids: Secondary | ICD-10-CM | POA: Insufficient documentation

## 2022-07-06 DIAGNOSIS — D122 Benign neoplasm of ascending colon: Secondary | ICD-10-CM | POA: Diagnosis not present

## 2022-07-06 DIAGNOSIS — K921 Melena: Secondary | ICD-10-CM

## 2022-07-06 DIAGNOSIS — K625 Hemorrhage of anus and rectum: Secondary | ICD-10-CM | POA: Diagnosis not present

## 2022-07-06 DIAGNOSIS — K635 Polyp of colon: Secondary | ICD-10-CM | POA: Diagnosis not present

## 2022-07-06 DIAGNOSIS — L290 Pruritus ani: Secondary | ICD-10-CM

## 2022-07-06 DIAGNOSIS — K644 Residual hemorrhoidal skin tags: Secondary | ICD-10-CM | POA: Diagnosis not present

## 2022-07-06 DIAGNOSIS — R194 Change in bowel habit: Secondary | ICD-10-CM

## 2022-07-06 DIAGNOSIS — K649 Unspecified hemorrhoids: Secondary | ICD-10-CM | POA: Diagnosis not present

## 2022-07-06 DIAGNOSIS — D126 Benign neoplasm of colon, unspecified: Secondary | ICD-10-CM

## 2022-07-06 HISTORY — PX: COLONOSCOPY WITH PROPOFOL: SHX5780

## 2022-07-06 LAB — POCT PREGNANCY, URINE: Preg Test, Ur: NEGATIVE

## 2022-07-06 SURGERY — COLONOSCOPY WITH PROPOFOL
Anesthesia: General

## 2022-07-06 MED ORDER — PROPOFOL 500 MG/50ML IV EMUL
INTRAVENOUS | Status: DC | PRN
  Administered 2022-07-06: 150 ug/kg/min via INTRAVENOUS

## 2022-07-06 MED ORDER — PROPOFOL 10 MG/ML IV BOLUS
INTRAVENOUS | Status: DC | PRN
  Administered 2022-07-06: 120 mg via INTRAVENOUS

## 2022-07-06 MED ORDER — LIDOCAINE HCL (CARDIAC) PF 100 MG/5ML IV SOSY
PREFILLED_SYRINGE | INTRAVENOUS | Status: DC | PRN
  Administered 2022-07-06: 40 mg via INTRAVENOUS

## 2022-07-06 MED ORDER — SODIUM CHLORIDE 0.9 % IV SOLN
INTRAVENOUS | Status: DC
Start: 2022-07-06 — End: 2022-07-06

## 2022-07-06 MED ORDER — PROPOFOL 1000 MG/100ML IV EMUL
INTRAVENOUS | Status: AC
  Filled 2022-07-06: qty 100

## 2022-07-06 NOTE — Transfer of Care (Signed)
Immediate Anesthesia Transfer of Care Note  Patient: Lydia Jensen  Procedure(s) Performed: COLONOSCOPY WITH PROPOFOL  Patient Location: PACU and Endoscopy Unit  Anesthesia Type:General  Level of Consciousness: drowsy  Airway & Oxygen Therapy: Patient Spontanous Breathing  Post-op Assessment: Report given to RN and Post -op Vital signs reviewed and stable  Post vital signs: Reviewed and stable  Last Vitals:  Vitals Value Taken Time  BP 105/57 07/06/22 0906  Temp 36.1 C 07/06/22 0905  Pulse 70 07/06/22 0906  Resp 23 07/06/22 0906  SpO2 99 % 07/06/22 0906  Vitals shown include unvalidated device data.  Last Pain:  Vitals:   07/06/22 0905  TempSrc: Temporal  PainSc:          Complications: No notable events documented.

## 2022-07-06 NOTE — H&P (Signed)
     Jonathon Bellows, MD [address], [place], [place], [place], [address] [address], [place], [place], [place], [address] Phone: [phone]  Fax: [phone]  Primary Care Physician:  Patient, No Pcp Per   Pre-Procedure History & Physical: HPI:  Lydia Jensen is a 37 y.o. female is here for an colonoscopy.   History reviewed. No pertinent past medical history.  Past Surgical History:  Procedure Laterality Date   APPENDECTOMY     MENISCUS REPAIR      Prior to Admission medications   Medication Sig Start Date End Date Taking? Authorizing Provider  amphetamine-dextroamphetamine (ADDERALL XR) 30 MG 24 hr capsule Take 1 capsule (30 mg total) by mouth every morning. For 30 days after signed 12/27/14 08/21/20  Leandrew Koyanagi, MD  drospirenone-ethinyl estradiol Sherrill Raring) 3-0.02 MG tablet Take 1 tablet by mouth daily. 12/27/14 08/21/20  Leandrew Koyanagi, MD  topiramate (TOPAMAX) 100 MG tablet Take 1 tablet (100 mg total) by mouth daily. 12/27/14 08/21/20  Leandrew Koyanagi, MD    Allergies as of 06/29/2022   (No Known Allergies)    Family History  Problem Relation Age of Onset   Cancer Mother     Social History   Socioeconomic History   Marital status: Single    Spouse name: Not on file   Number of children: Not on file   Years of education: Not on file   Highest education level: Not on file  Occupational History   Not on file  Tobacco Use   Smoking status: Never   Smokeless tobacco: Never  Vaping Use   Vaping Use: Never used  Substance and Sexual Activity   Alcohol use: No   Drug use: No   Sexual activity: Not on file  Other Topics Concern   Not on file  Social History Narrative   Not on file   Social Determinants of Health   Financial Resource Strain: Not on file  Food Insecurity: Not on file  Transportation Needs: Not on file  Physical Activity: Not on file  Stress: Not on file  Social Connections: Not on file  Intimate Partner  Violence: Not on file    Review of Systems: See HPI, otherwise negative ROS  Physical Exam: BP 126/77   Pulse (!) 109   Temp 98.6 F (37 C) (Temporal)   Resp 16   Ht '5\' 6"'$  (1.676 m)   Wt 70.8 kg   SpO2 100%   BMI 25.18 kg/m  General:   Alert,  pleasant and cooperative in NAD Head:  Normocephalic and atraumatic. Neck:  Supple; no masses or thyromegaly. Lungs:  Clear throughout to auscultation, normal respiratory effort.    Heart:  +S1, +S2, Regular rate and rhythm, No edema. Abdomen:  Soft, nontender and nondistended. Normal bowel sounds, without guarding, and without rebound.   Neurologic:  Alert and  oriented x4;  grossly normal neurologically.  Impression/Plan: Lydia Jensen is here for an colonoscopy to be performed for rectal bleeding.  Risks, benefits, limitations, and alternatives regarding  colonoscopy have been reviewed with the patient.  Questions have been answered.  All parties agreeable.   Jonathon Bellows, MD  07/06/2022, 8:32 AM

## 2022-07-06 NOTE — Op Note (Signed)
[facility] Patient Name: Lydia Jensen Procedure Date: 07/06/2022 8:33 AM MRN: [id number] Account #: [id number] Date of Birth: April 04, 1986 Admit Type: Outpatient Age: 37 Room: [facility] ENDO ROOM 2 Gender: Female Note Status: Finalized Instrument Name: Jasper Riling [id number] Procedure:             Colonoscopy Indications:           Rectal bleeding Providers:             Jonathon Bellows MD, MD Referring MD:          No Local Md, MD (Referring MD) Medicines:             Monitored Anesthesia Care Procedure:             Pre-Anesthesia Assessment:                        - Prior to the procedure, a History and Physical was                         performed, and patient medications, allergies and                         sensitivities were reviewed. The patient's tolerance                         of previous anesthesia was reviewed.                        - The risks and benefits of the procedure and the                         sedation options and risks were discussed with the                         patient. All questions were answered and informed                         consent was obtained.                        - ASA Grade Assessment: II - A patient with mild                         systemic disease.                        After obtaining informed consent, the colonoscope was                         passed under direct vision. Throughout the procedure,                         the patient's blood pressure, pulse, and oxygen                         saturations were monitored continuously. The                         Colonoscope was introduced through the anus and  advanced to the the cecum, identified by the                         appendiceal orifice. The colonoscopy was performed                         with ease. The patient tolerated the procedure well.                         The quality of the bowel preparation was excellent.                          The ileocecal valve, appendiceal orifice, and rectum                         were photographed. Findings:      An 8 mm polyp was found in the ascending colon. The polyp was sessile.       The polyp was removed with a cold snare. Resection and retrieval were       complete.      Non-bleeding internal hemorrhoids were found during retroflexion. The       hemorrhoids were medium-sized and Grade I (internal hemorrhoids that do       not prolapse).      Skin tags were found on perianal exam. Impression:            - One 8 mm polyp in the ascending colon, removed with                         a cold snare. Resected and retrieved.                        - Non-bleeding internal hemorrhoids.                        - Perianal skin tags found on perianal exam. Recommendation:        - Discharge patient to home (with escort).                        - Resume previous diet.                        - Continue present medications.                        - Await pathology results.                        - Repeat colonoscopy for surveillance based on                         pathology results.                        - Return to GI office as previously scheduled. Procedure Code(s):     --- Professional ---                        [phone], Colonoscopy, flexible; with removal of  tumor(s), polyp(s), or other lesion(s) by snare                         technique Diagnosis Code(s):     --- Professional ---                        D12.2, Benign neoplasm of ascending colon                        K64.0, First degree hemorrhoids                        K64.4, Residual hemorrhoidal skin tags                        K62.5, Hemorrhage of anus and rectum CPT copyright 2022 American Medical Association. All rights reserved. The codes documented in this report are preliminary and upon coder review may  be revised to meet current compliance requirements. Jonathon Bellows, MD Jonathon Bellows MD,  MD 07/06/2022 9:04:06 AM This report has been signed electronically. Number of Addenda: 0 Note Initiated On: 07/06/2022 8:33 AM Scope Withdrawal Time: 0 hours 9 minutes 53 seconds  Total Procedure Duration: 0 hours 12 minutes 31 seconds       [facility]

## 2022-07-06 NOTE — Anesthesia Preprocedure Evaluation (Signed)
Anesthesia Evaluation  Patient identified by MRN, date of birth, ID band Patient awake    Reviewed: Allergy & Precautions, H&P , NPO status , Patient's Chart, lab work & pertinent test results, reviewed documented beta blocker date and time   History of Anesthesia Complications Negative for: history of anesthetic complications  Airway Mallampati: II  TM Distance: >3 FB Neck ROM: full    Dental  (+) Dental Advidsory Given, Caps, Teeth Intact   Pulmonary neg pulmonary ROS   Pulmonary exam normal breath sounds clear to auscultation       Cardiovascular Exercise Tolerance: Good negative cardio ROS Normal cardiovascular exam Rhythm:regular Rate:Normal     Neuro/Psych negative neurological ROS  negative psych ROS   GI/Hepatic negative GI ROS, Neg liver ROS,,,  Endo/Other  negative endocrine ROS    Renal/GU negative Renal ROS  negative genitourinary   Musculoskeletal   Abdominal   Peds  Hematology negative hematology ROS (+)   Anesthesia Other Findings History reviewed. No pertinent past medical history.   Reproductive/Obstetrics negative OB ROS                             Anesthesia Physical Anesthesia Plan  ASA: 1  Anesthesia Plan: General   Post-op Pain Management:    Induction: Intravenous  PONV Risk Score and Plan: 3 and Propofol infusion and TIVA  Airway Management Planned: Natural Airway and Nasal Cannula  Additional Equipment:   Intra-op Plan:   Post-operative Plan:   Informed Consent: I have reviewed the patients History and Physical, chart, labs and discussed the procedure including the risks, benefits and alternatives for the proposed anesthesia with the patient or authorized representative who has indicated his/her understanding and acceptance.     Dental Advisory Given  Plan Discussed with: Anesthesiologist, CRNA and Surgeon  Anesthesia Plan Comments:         Anesthesia Quick Evaluation

## 2022-07-07 ENCOUNTER — Encounter: Payer: Self-pay | Admitting: Gastroenterology

## 2022-07-07 LAB — SURGICAL PATHOLOGY

## 2022-07-22 NOTE — Anesthesia Postprocedure Evaluation (Signed)
Anesthesia Post Note  Patient: Lydia Jensen  Procedure(s) Performed: COLONOSCOPY WITH PROPOFOL  Patient location during evaluation: Endoscopy Anesthesia Type: General Level of consciousness: awake and alert Pain management: pain level controlled Vital Signs Assessment: post-procedure vital signs reviewed and stable Respiratory status: spontaneous breathing, nonlabored ventilation, respiratory function stable and patient connected to nasal cannula oxygen Cardiovascular status: blood pressure returned to baseline and stable Postop Assessment: no apparent nausea or vomiting Anesthetic complications: no   No notable events documented.   Last Vitals:  Vitals:   07/06/22 0905 07/06/22 0925  BP: (!) 105/57 126/83  Pulse:    Resp:    Temp: (!) 36.1 C   SpO2:  100%    Last Pain:  Vitals:   07/07/22 0826  TempSrc:   PainSc: 0-No pain                 Martha Clan

## 2022-07-29 ENCOUNTER — Encounter: Payer: Medicaid Other | Admitting: Obstetrics and Gynecology

## 2022-07-29 ENCOUNTER — Telehealth: Payer: Self-pay

## 2022-07-29 NOTE — Telephone Encounter (Signed)
Patient called stating that Dr. Vicente Males told her to call the office if she continued to have issues with rectal bleeding. She stated that she was informed that she had rectal bleeding again and wanted to be seen. I looked to see when I could schedule her an appointment and saw that she already had an appointment with him for 08/06/2022. I told her that unfortunately Dr. Vicente Males was going to be out of the office, therefore, we were not able to see her prior. However, she stated that she knew what to do is her rectal bleeding got worse, to go to the ED. I told her that it was correct. Patient was okay and stated that she will wait to be seen then. Patient had no further questions.

## 2022-08-16 ENCOUNTER — Ambulatory Visit (INDEPENDENT_AMBULATORY_CARE_PROVIDER_SITE_OTHER): Payer: Medicaid Other | Admitting: Gastroenterology

## 2022-08-16 VITALS — BP 132/82 | HR 78 | Temp 98.7°F | Wt 157.8 lb

## 2022-08-16 DIAGNOSIS — K602 Anal fissure, unspecified: Secondary | ICD-10-CM

## 2022-08-16 DIAGNOSIS — K648 Other hemorrhoids: Secondary | ICD-10-CM | POA: Diagnosis not present

## 2022-08-16 NOTE — Progress Notes (Signed)
    Jonathon Bellows MD, MRCP(U.K) [address]  [place]  [place], [place] [address]  Main: [phone]  Fax: [phone]   Primary Care Physician: Patient, No Pcp Per  Primary Gastroenterologist:  Dr. Jonathon Bellows   Chief Complaint  Patient presents with   Blood In Stools    HPI: Lydia Jensen is a 37 y.o. female with a she is seen on 06/29/2022 for issues with hemorrhoids, complained of perianal itching discomfort with bowel movements associated with some blood in the stools tried sitz bath at home did not help.  Discussed about conservative management of internal hemorrhoids we performed a colonoscopy on 07/06/2022 found to have medium size grade 1 internal hemorrhoids and a sessile 8 mm polyp was resected in the ascending colon which was a tubular adenoma.  She had perianal skin tags.  Since colonoscopy continues to have pain during defecation as well as blood on the stool  No current outpatient medications on file.   No current facility-administered medications for this visit.    Allergies as of 08/16/2022   (No Known Allergies)    ROS:  General: Negative for anorexia, weight loss, fever, chills, fatigue, weakness. ENT: Negative for hoarseness, difficulty swallowing , nasal congestion. CV: Negative for chest pain, angina, palpitations, dyspnea on exertion, peripheral edema.  Respiratory: Negative for dyspnea at rest, dyspnea on exertion, cough, sputum, wheezing.  GI: See history of present illness. GU:  Negative for dysuria, hematuria, urinary incontinence, urinary frequency, nocturnal urination.  Endo: Negative for unusual weight change.    Physical Examination:   BP 132/82   Pulse 78   Temp 98.7 F (37.1 C) (Oral)   Wt 157 lb 12.8 oz (71.6 kg)   BMI 25.47 kg/m   General: Well-nourished, well-developed in no acute distress.  Eyes: No icterus. Conjunctivae pink. Neuro: Alert and oriented x 3.  Grossly intact. Skin: Warm and dry, no jaundice.   Psych: Alert  and cooperative, normal mood and affect.  Chaperone present in the room tenderness present in the posterior wall of the anal canal suggesting an anal fissure did not introduce a finger inside Imaging Studies: No results found.  Assessment and Plan:   Lydia Jensen is a 37 y.o. y/o female here to follow-up for blood in the stool attributed to internal hemorrhoids confirmed on colonoscopy.  Today she has history and examination findings consistent with a posterior anal fissure  Plan 1.  Will commence on topical nitroglycerin will send prescription to compound pharmacy will treat for 4 weeks.  Discussed perianal toileting and hygiene and if continues to have bleeding will proceed with hemorrhoidal banding at next visit    Dr Jonathon Bellows  MD,MRCP [facility]) Follow up in 4 weeks

## 2022-08-16 NOTE — Patient Instructions (Addendum)
Please go to [facility] and pick up your prescription. [facility] [address], [place], [place] [id number] Mon-Fri: 8:30a.m.-6p.m.  Sat: 8:30a.m.-1p.m.  Sun: Closed

## 2022-09-14 ENCOUNTER — Ambulatory Visit (INDEPENDENT_AMBULATORY_CARE_PROVIDER_SITE_OTHER): Payer: Medicaid Other | Admitting: Gastroenterology

## 2022-09-14 ENCOUNTER — Encounter: Payer: Self-pay | Admitting: Gastroenterology

## 2022-09-14 VITALS — BP 136/86 | HR 77 | Temp 98.7°F | Ht 65.0 in | Wt 154.6 lb

## 2022-09-14 DIAGNOSIS — K602 Anal fissure, unspecified: Secondary | ICD-10-CM

## 2022-09-14 DIAGNOSIS — K648 Other hemorrhoids: Secondary | ICD-10-CM

## 2022-09-14 DIAGNOSIS — K921 Melena: Secondary | ICD-10-CM | POA: Diagnosis not present

## 2022-09-14 NOTE — Progress Notes (Signed)
    Wyline Mood MD, MRCP(U.K) [address]  [address]  [place], [place] [address]  Main: [phone]  Fax: [phone]   Primary Care Physician: Patient, No Pcp Per  Primary Gastroenterologist:  Dr. Wyline Mood   Chief Complaint  Patient presents with   Hemorrhoids    HPI: Lydia Jensen is a 37 y.o. female   Summary of history :  Initially referred and seen in 05/2022 for issues with hemorroids and on examination noted to have a posterior anal fissure and she was symptomatic. colonoscopy on 07/06/2022 found to have medium size grade 1 internal hemorrhoids and a sessile 8 mm polyp was resected in the ascending colon which was a tubular adenoma. She had perianal skin tags.   Interval history  3/182024-09/14/2022  Treated with NTG for anal fissure and since then she has had no issues with pain in the anal area and wants to get her hemorrhoids banded today   PROCEDURE NOTE: The patient presents with symptomatic grade 1 hemorrhoids, unresponsive to maximal medical therapy, requesting rubber band ligation of his/her hemorrhoidal disease.  All risks, benefits and alternative forms of therapy were described and informed consent was obtained.  In the Left Lateral Decubitus position (if anoscopy is performed) anoscopic examination revealed grade 1 hemorrhoids in the all position(s).   The decision was made to band the LL internal hemorrhoid, and the [facility] O'Regan System was used to perform band ligation without complication.  Digital anorectal examination was then performed to assure proper positioning of the band, and to adjust the banded tissue as required.  The patient was discharged home without pain or other issues.  Dietary and behavioral recommendations were given and (if necessary - prescriptions were given), along with follow-up instructions.  The patient will return 4 weeks for follow-up and possible additional banding as required.  No complications were encountered and the  patient tolerated the procedure well.    No current outpatient medications on file.   No current facility-administered medications for this visit.    Allergies as of 09/14/2022   (No Known Allergies)    ROS:  General: Negative for anorexia, weight loss, fever, chills, fatigue, weakness. ENT: Negative for hoarseness, difficulty swallowing , nasal congestion. CV: Negative for chest pain, angina, palpitations, dyspnea on exertion, peripheral edema.  Respiratory: Negative for dyspnea at rest, dyspnea on exertion, cough, sputum, wheezing.  GI: See history of present illness. GU:  Negative for dysuria, hematuria, urinary incontinence, urinary frequency, nocturnal urination.  Endo: Negative for unusual weight change.    Physical Examination:   BP 136/86   Pulse 77   Temp 98.7 F (37.1 C)   Ht  (1.651 m)   Wt 154 lb 9.6 oz (70.1 kg)   BMI 25.73 kg/m   General: Well-nourished, well-developed in no acute distress.  Psych: Alert and cooperative, normal mood and affect.   Imaging Studies: No results found.  Assessment and Plan:   Lydia Jensen is a 37 y.o. y/o female here to follow up for anal fissure and rectal bleeding from internal hemorroids.  Status post banding of the left lateral column  Follow-up in 4 weeks if has pain advised to return back to the office tomorrow    Dr Wyline Mood  MD,MRCP [facility]) Follow up in 4 weeks

## 2022-10-14 ENCOUNTER — Ambulatory Visit: Payer: Medicaid Other | Admitting: Gastroenterology

## 2022-10-14 NOTE — Progress Notes (Deleted)
Patient follow-ups today for banding of hemorrhoids    Summary of history :  Summary of history :   Initially referred and seen in 05/2022 for issues with hemorroids and on examination noted to have a posterior anal fissure and she was symptomatic. colonoscopy on 07/06/2022 found to have medium size grade 1 internal hemorrhoids and a sessile 8 mm polyp was resected in the ascending colon which was a tubular adenoma. She had perianal skin tags. Treated with NTG for anal fissure and since then she has had no issues with pain in the anal area  First round:09/14/2022: LL column banded   Interval history  09/14/2022-10/14/2022         Digital rectal exam performed in the presence of a chaperone. External anal findings: *** Internal findings:*** , No masses, no blood on glove noticed.    PROCEDURE NOTE: The patient presents with symptomatic grade {1-4:31454} hemorrhoids, unresponsive to maximal medical therapy, requesting rubber band ligation of his/her hemorrhoidal disease.  All risks, benefits and alternative forms of therapy were described and informed consent was obtained.  In the Left Lateral Decubitus position (if anoscopy is performed) anoscopic examination revealed grade {1-4:31454} hemorrhoids in the {CHL AMB HEMORRHOID POSITION:210130901} position(s).   The decision was made to band the {CHL AMB HEMORRHOID POSITION:210130901} internal hemorrhoid, and the [facility] O'Regan System was used to perform band ligation without complication.  Digital anorectal examination was then performed to assure proper positioning of the band, and to adjust the banded tissue as required.  The patient was discharged home without pain or other issues.  Dietary and behavioral recommendations were given and (if necessary - prescriptions were given), along with follow-up instructions.  The patient will return {1-4:31454} {CHL AMB WEEKS/MONTHS/AS NEEDED:210130900} for follow-up and possible additional banding as  required.  No complications were encountered and the patient tolerated the procedure well.   Plan:  Avoid constipation.  Commence on stool softeners if not already on  Follow-up:***  Dr Wyline Mood MD,MRCP [facility]) Gastroenterology/Hepatology Pager: [phone]  BP check ***

## 2022-10-17 ENCOUNTER — Telehealth: Payer: Medicaid Other | Admitting: Nurse Practitioner

## 2022-10-17 DIAGNOSIS — B9689 Other specified bacterial agents as the cause of diseases classified elsewhere: Secondary | ICD-10-CM

## 2022-10-17 DIAGNOSIS — N760 Acute vaginitis: Secondary | ICD-10-CM

## 2022-10-18 MED ORDER — METRONIDAZOLE 0.75 % VA GEL: 1.0000 | Freq: Every day | VAGINAL | 0 refills | Status: AC

## 2022-10-18 NOTE — Progress Notes (Signed)
E-Visit for Vaginal Symptoms  We are sorry that you are not feeling well. Here is how we plan to help! Based on what you shared with me it looks like you: May have a vaginosis due to bacteria  Vaginosis is an inflammation of the vagina that can result in discharge, itching and pain. The cause is usually a change in the normal balance of vaginal bacteria or an infection. Vaginosis can also result from reduced estrogen levels after menopause.  The most common causes of vaginosis are:   Bacterial vaginosis which results from an overgrowth of one on several organisms that are normally present in your vagina.   Yeast infections which are caused by a naturally occurring fungus called candida.   Vaginal atrophy (atrophic vaginosis) which results from the thinning of the vagina from reduced estrogen levels after menopause.   Trichomoniasis which is caused by a parasite and is commonly transmitted by sexual intercourse.  Factors that increase your risk of developing vaginosis include: Medications, such as antibiotics and steroids Uncontrolled diabetes Use of hygiene products such as bubble bath, vaginal spray or vaginal deodorant Douching Wearing damp or tight-fitting clothing Using an intrauterine device (IUD) for birth control Hormonal changes, such as those associated with pregnancy, birth control pills or menopause Sexual activity Having a sexually transmitted infection  Your treatment plan is:   Be sure to take all of the medication as directed. Stop taking any medication if you develop a rash, tongue swelling or shortness of breath. Mothers who are breast feeding should consider pumping and discarding their breast milk while on these antibiotics. However, there is no consensus that infant exposure at these doses would be harmful.  Remember that medication creams can weaken latex condoms. . Meds ordered this encounter  Medications   metroNIDAZOLE (METROGEL) 0.75 % vaginal gel    Sig:  Place 1 Applicatorful vaginally at bedtime for 7 days.    Dispense:  70 g    Refill:  0     HOME CARE:  Good hygiene may prevent some types of vaginosis from recurring and may relieve some symptoms:  Avoid baths, hot tubs and whirlpool spas. Rinse soap from your outer genital area after a shower, and dry the area well to prevent irritation. Don't use scented or harsh soaps, such as those with deodorant or antibacterial action. Avoid irritants. These include scented tampons and pads. Wipe from front to back after using the toilet. Doing so avoids spreading fecal bacteria to your vagina.  Other things that may help prevent vaginosis include:  Don't douche. Your vagina doesn't require cleansing other than normal bathing. Repetitive douching disrupts the normal organisms that reside in the vagina and can actually increase your risk of vaginal infection. Douching won't clear up a vaginal infection. Use a latex condom. Both female and female latex condoms may help you avoid infections spread by sexual contact. Wear cotton underwear. Also wear pantyhose with a cotton crotch. If you feel comfortable without it, skip wearing underwear to bed. Yeast thrives in [employer] Your symptoms should improve in the next day or two.  GET HELP RIGHT AWAY IF:  You have pain in your lower abdomen ( pelvic area or over your ovaries) You develop nausea or vomiting You develop a fever Your discharge changes or worsens You have persistent pain with intercourse You develop shortness of breath, a rapid pulse, or you faint.  These symptoms could be signs of problems or infections that need to be evaluated by a medical  provider now.  MAKE SURE YOU   Understand these instructions. Will watch your condition. Will get help right away if you are not doing well or get worse.  Thank you for choosing an e-visit.  Your e-visit answers were reviewed by a board certified advanced clinical practitioner to  complete your personal care plan. Depending upon the condition, your plan could have included both over the counter or prescription medications.  I spent approximately 5 minutes reviewing the patient's history, current symptoms and coordinating their care today.

## 2022-10-22 ENCOUNTER — Encounter: Payer: Self-pay | Admitting: *Deleted

## 2022-12-08 ENCOUNTER — Telehealth: Payer: Medicaid Other | Admitting: Nurse Practitioner

## 2022-12-08 DIAGNOSIS — B3731 Acute candidiasis of vulva and vagina: Secondary | ICD-10-CM | POA: Diagnosis not present

## 2022-12-08 MED ORDER — FLUCONAZOLE 150 MG PO TABS: 150.0000 mg | ORAL_TABLET | Freq: Once | ORAL | 0 refills | Status: AC

## 2022-12-08 NOTE — Addendum Note (Signed)
Addended by: Tareq Dwan, MARY-MARGARET on: 12/08/2022 01:12 PM   Modules accepted: Level of Service  

## 2022-12-08 NOTE — Progress Notes (Signed)
